# Patient Record
Sex: Female | Born: 1980 | Race: White | Hispanic: No | Marital: Married | Smoking: Current every day smoker
Health system: Southern US, Community
[De-identification: ages and names within clinical notes are randomized; demographics above are authoritative.]

## PROBLEM LIST (undated history)

## (undated) DIAGNOSIS — Z8614 Personal history of Methicillin resistant Staphylococcus aureus infection: Secondary | ICD-10-CM

## (undated) DIAGNOSIS — M542 Cervicalgia: Secondary | ICD-10-CM

## (undated) DIAGNOSIS — G5603 Carpal tunnel syndrome, bilateral upper limbs: Secondary | ICD-10-CM

## (undated) DIAGNOSIS — Z8489 Family history of other specified conditions: Secondary | ICD-10-CM

## (undated) DIAGNOSIS — G5693 Unspecified mononeuropathy of bilateral upper limbs: Secondary | ICD-10-CM

---

## 2006-02-17 ENCOUNTER — Emergency Department: Payer: Self-pay | Admitting: Emergency Medicine

## 2006-07-23 ENCOUNTER — Emergency Department: Payer: Self-pay | Admitting: Emergency Medicine

## 2006-07-25 ENCOUNTER — Emergency Department: Payer: Self-pay | Admitting: Emergency Medicine

## 2006-08-08 ENCOUNTER — Emergency Department: Payer: Self-pay | Admitting: Emergency Medicine

## 2006-12-08 ENCOUNTER — Emergency Department: Payer: Self-pay | Admitting: Unknown Physician Specialty

## 2007-09-29 ENCOUNTER — Emergency Department: Payer: Self-pay | Admitting: Emergency Medicine

## 2008-03-23 ENCOUNTER — Emergency Department: Payer: Self-pay | Admitting: Emergency Medicine

## 2008-08-02 ENCOUNTER — Emergency Department: Payer: Self-pay | Admitting: Emergency Medicine

## 2009-07-18 ENCOUNTER — Emergency Department: Payer: Self-pay | Admitting: Emergency Medicine

## 2010-09-14 ENCOUNTER — Emergency Department: Payer: Self-pay | Admitting: Emergency Medicine

## 2012-10-08 ENCOUNTER — Emergency Department: Payer: Self-pay | Admitting: Internal Medicine

## 2012-10-08 LAB — RAPID INFLUENZA A&B ANTIGENS

## 2013-10-11 IMAGING — CT CT HEAD WITHOUT CONTRAST
1 series · 16 of 30 positions shown, 20 images · non-contrast
Comparison: none

REASON FOR EXAM: headache for 3 days
COMMENTS:   LMP: pending results of preg test

PROCEDURE:     CT  - CT HEAD WITHOUT CONTRAST  - October 08, 2012 [DATE]
RESULT:     Technique: Helical 5mm sections were obtained from the skull
base to the vertex without administration of intravenous contrast.

[Series 2: soft tissue · axial · 0.40mm/px · z∈[-56,+84]mm · 16 of 32 slices shown, 20 images]
[im 2/32  brain]
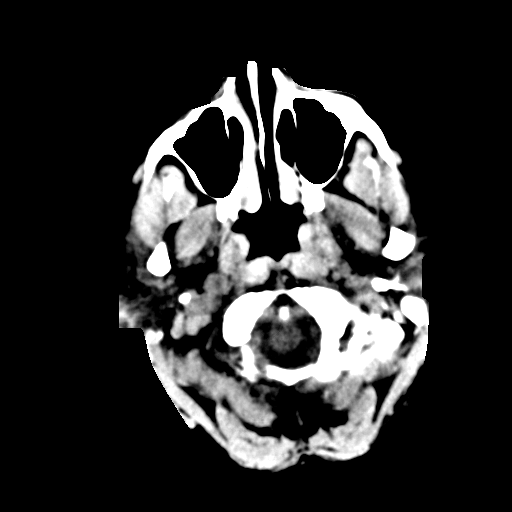
[im 2/32  bone]
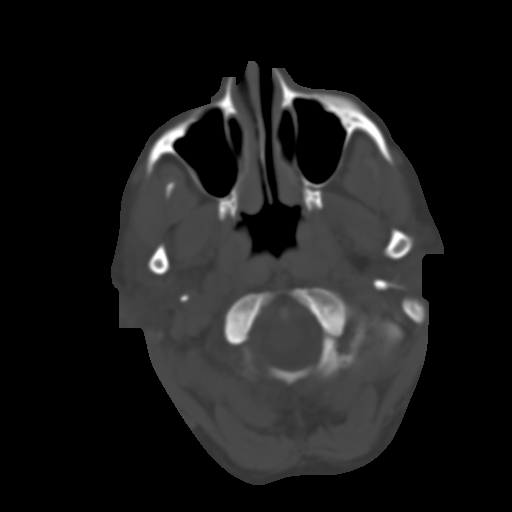
[im 4/32  brain]
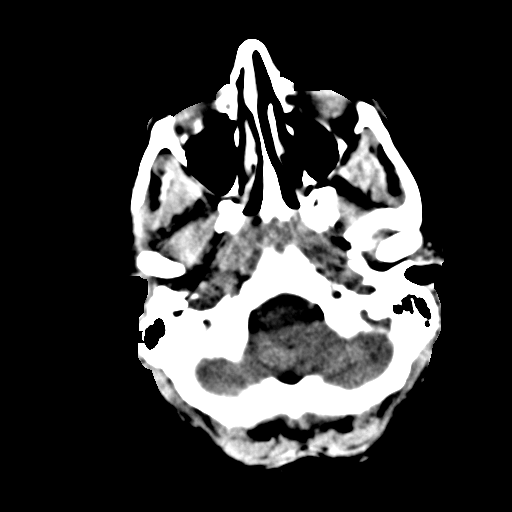
[im 6/32  brain]
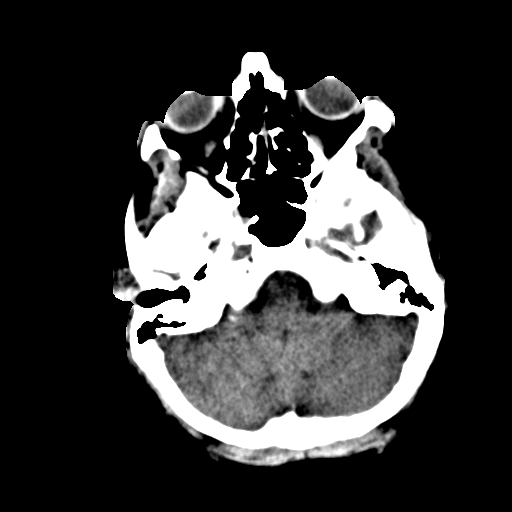
[im 8/32  brain]
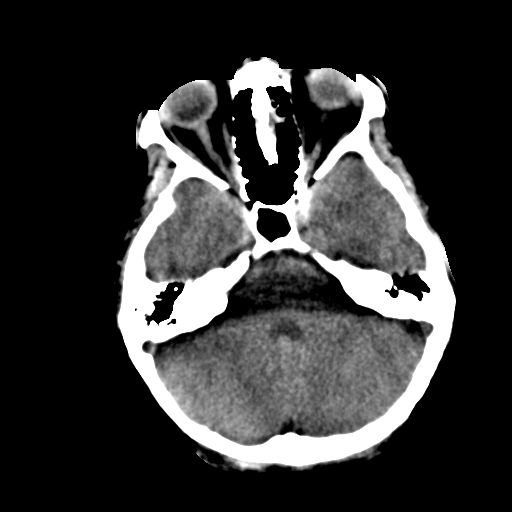
[im 9/32  brain]
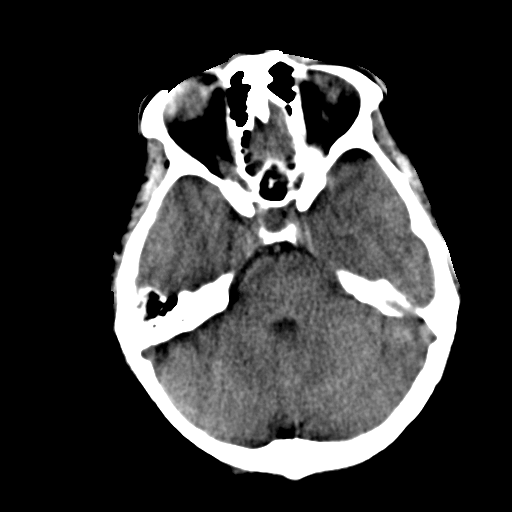
[im 9/32  bone]
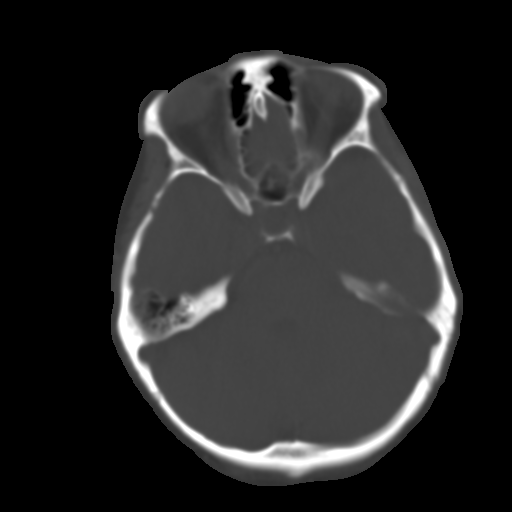
[im 11/32  brain]
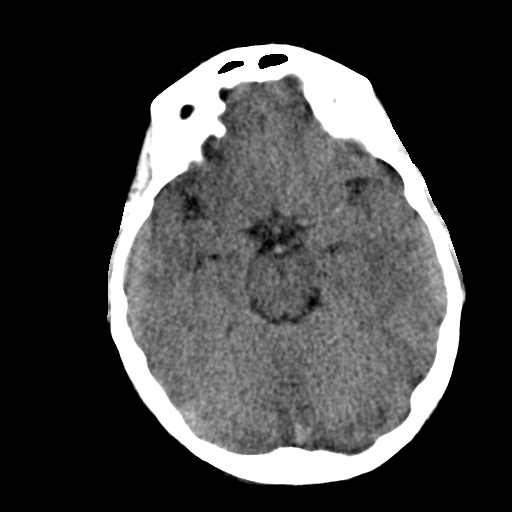
[im 13/32  brain]
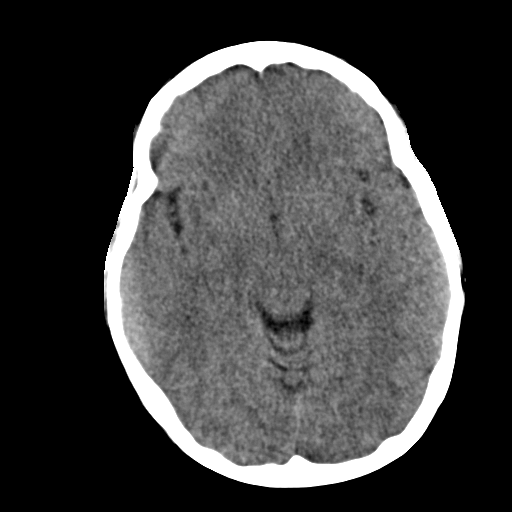
[im 15/32  brain]
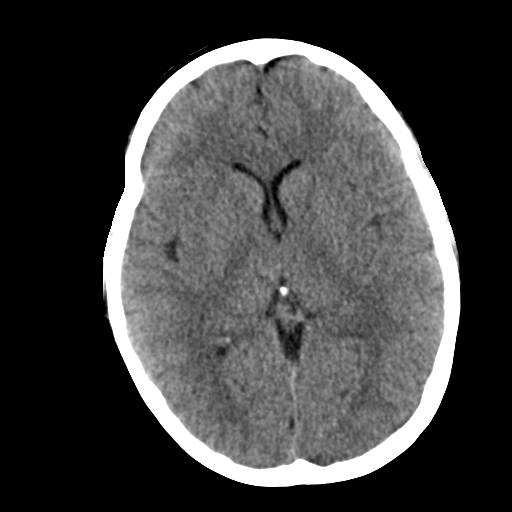
[im 17/32  brain]
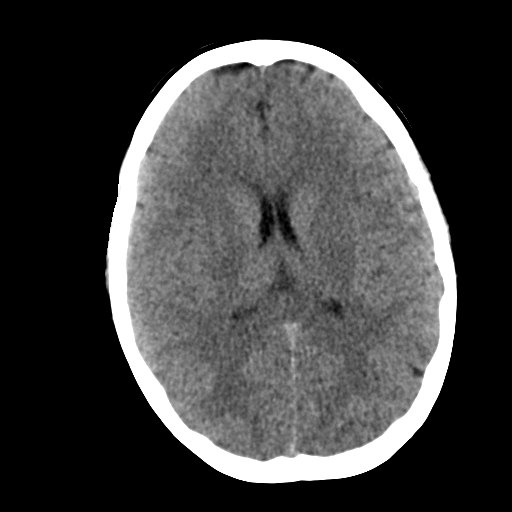
[im 17/32  bone]
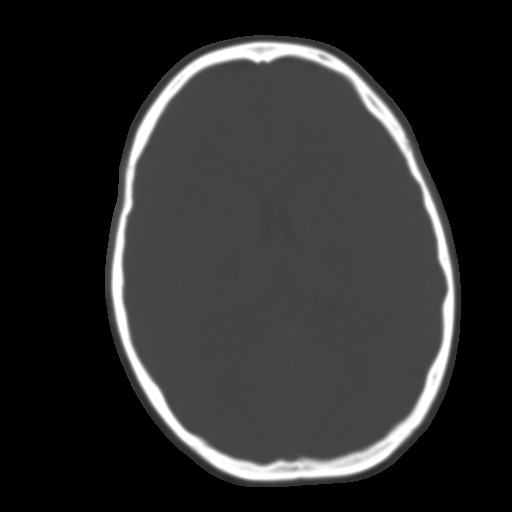
[im 19/32  brain]
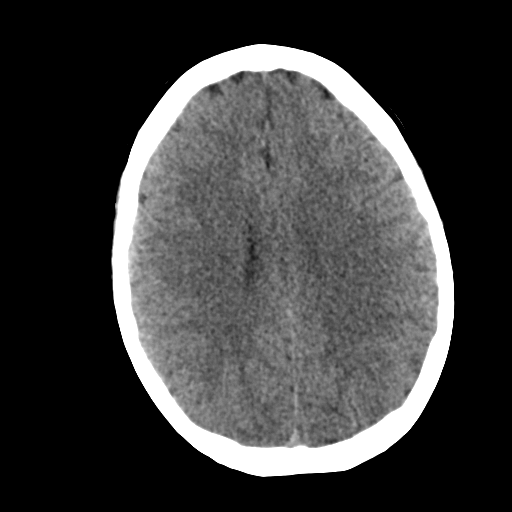
[im 21/32  brain]
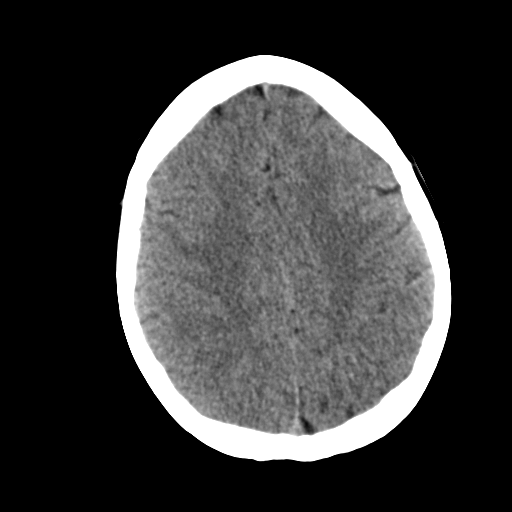
[im 23/32  brain]
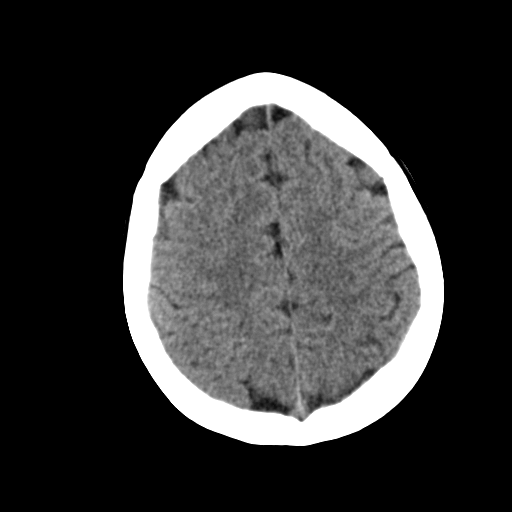
[im 24/32  brain]
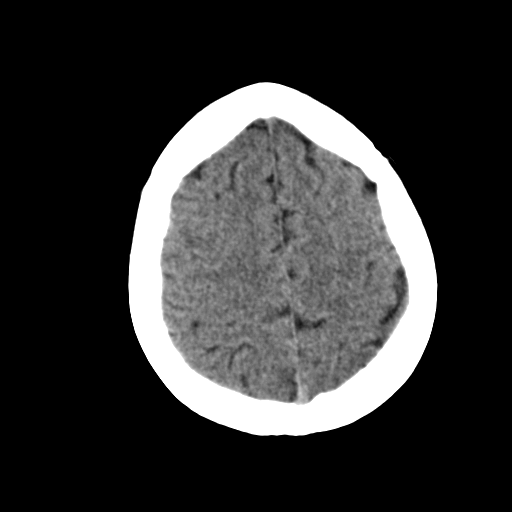
[im 24/32  bone]
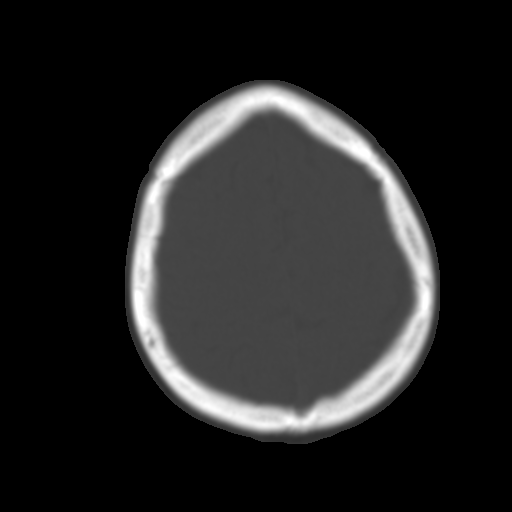
[im 26/32  brain]
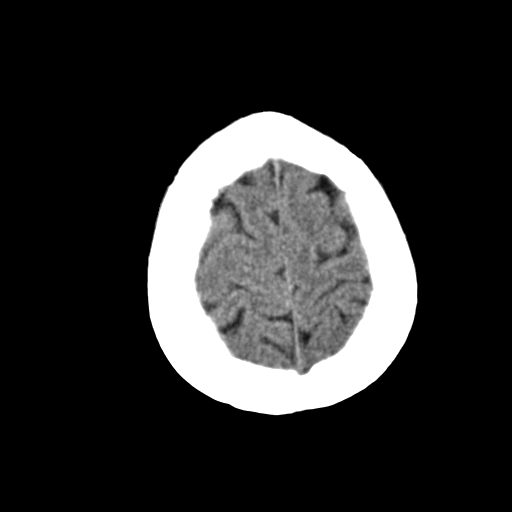
[im 28/32  brain]
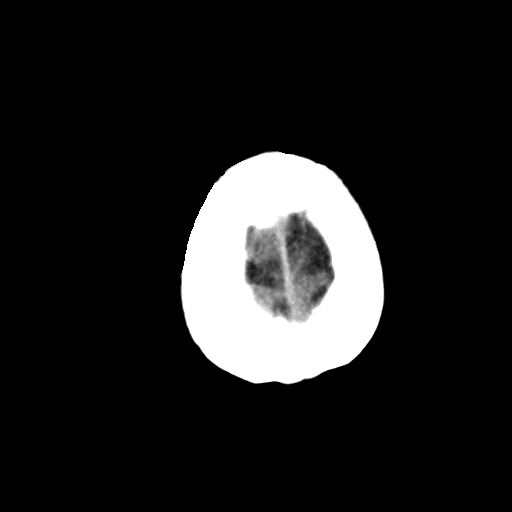
[im 30/32  brain]
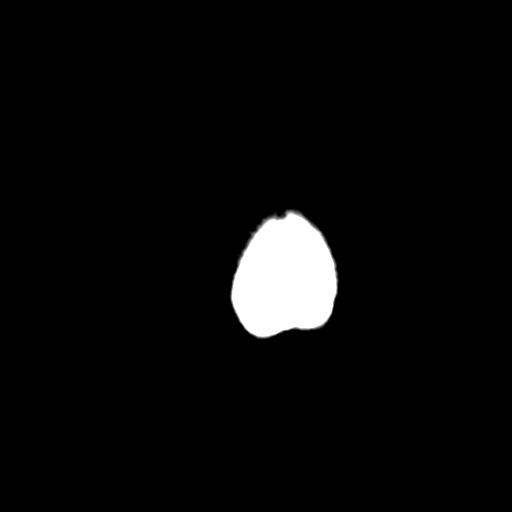

[16 of 30 positions shown; findings below may reference images not displayed]

FINDINGS: There is not evidence of intra-axial fluid collections. There is
no evidence of acute hemorrhage or secondary signs reflecting mass effect or
subacute or chronic focal territorial infarction. The osseous structures
demonstrate no evidence of a depressed skull fracture. If there is
persistent concern clinical follow-up with MRI is recommended.
IMPRESSION: 1. No evidence of acute intracranial abnormalities.

## 2013-10-29 ENCOUNTER — Other Ambulatory Visit: Payer: Self-pay

## 2013-10-29 LAB — HCG, QUANTITATIVE, PREGNANCY: Beta Hcg, Quant.: 1199 m[IU]/mL — ABNORMAL HIGH

## 2014-09-16 ENCOUNTER — Inpatient Hospital Stay: Payer: Self-pay

## 2014-09-17 LAB — HEMATOCRIT: HCT: 32.2 % — ABNORMAL LOW (ref 35.0–47.0)

## 2015-02-28 NOTE — H&P (Signed)
L&D Evaluation:  History:  HPI Pt is a 34 yo G8P2052 at 40.[redacted] weeks GA with an EDC of 09/15/14 by a 6 week u/s. Pt presents with increased painful contractions that started at 8am this morning. She reports that she has had contractions on and off during the entire pregnancy. She reports +FM, denies vb, or lof. Her history is significant for multiple fibroids, 5 SAB's, and a leep. She is O+, RI, VI, GBS negative.   Presents with contractions   Patient's Medical History migraines, fibroids,   Patient's Surgical History D&C  LEEP   Medications Pre Natal Vitamins   Allergies NKDA   Social History tobacco  < 1ppd   Family History Non-Contributory   ROS:  ROS All systems were reviewed.  HEENT, CNS, GI, GU, Respiratory, CV, Renal and Musculoskeletal systems were found to be normal.   Exam:  Vital Signs elevated- pt in severe pain   General appears in severe pain, pt crying and gripping the bed writhing in pain   Mental Status clear   Chest clear   Heart normal sinus rhythm   Abdomen gravid, tender with contractions   Pelvic C/C/-1   Mebranes Intact, AROm for clear fluid   Description clear   FHT normal rate with no decels, 1teens-120's, moderate variability   Ucx regular   Skin dry, no lesions, no rashes   Lymph no lymphadenopathy   Impression:  Impression active labor, reactive NST, IUP at 40.1, labor   Plan:  Plan EFM/NST, anticipate svd   Follow Up Appointment need to schedule   Electronic Signatures: Jannet MantisSubudhi, Valda Christenson (CNM)  (Signed 27-Nov-15 12:17)  Authored: L&D Evaluation   Last Updated: 27-Nov-15 12:17 by Jannet MantisSubudhi, Lukus Binion (CNM)

## 2017-12-14 ENCOUNTER — Other Ambulatory Visit: Payer: Self-pay | Admitting: Obstetrics and Gynecology

## 2017-12-15 NOTE — Telephone Encounter (Signed)
Please advise 

## 2019-07-22 HISTORY — PX: WISDOM TOOTH EXTRACTION: SHX21

## 2019-07-28 ENCOUNTER — Ambulatory Visit: Payer: Self-pay | Admitting: Surgery

## 2019-07-28 NOTE — H&P (Signed)
Subjective:  CC: Non-recurrent unilateral inguinal hernia without obstruction or gangrene [K40.90]  HPI:  Michaela Barajas is a 38 y.o. female who was referred by Thomas Janse Schermerho* for evaluation of above. Symptoms were first noted 6 months ago. Pain is intermittent and discomfort, confined to the right groin, without radiation. Associated with nothing specific, exacerbated by nothing specific, Lump is not reducible.  Past Medical History: none reported  Past Surgical History:       Past Surgical History:  Procedure Laterality Date  . HERNIA REPAIR    . TONSILLECTOMY    umbilical hernia repair as infant  Family History: reviewed and non-contributory to CC  Social History: reports that she has been smoking. She has never used smokeless tobacco. She reports current alcohol use. She reports that she does not use drugs.  Current Medications: has a current medication list which includes the following prescription(s): cholecalciferol and prenatal vit-iron fum-folic ac.  Allergies:     Allergies as of 07/28/2019  . (No Known Allergies)  ROS:  A 15 point review of systems was performed and pertinent positives and negatives noted in HPI  Objective:  BP 121/76  Pulse 91  Ht 167.6 cm (5' 6")  Wt 76.2 kg (168 lb)  BMI 27.12 kg/m  Constitutional :  alert, appears stated age, cooperative and no distress  Lymphatics/Throat:  no asymmetry, masses, or scars  Respiratory:  clear to auscultation bilaterally  Cardiovascular:  regular rate and rhythm  Gastrointestinal: soft, non-tender; bowel sounds normal; no masses, no organomegaly. inguinal hernia noted. moderate, reducible, no overlying skin changes and RIGHT  Musculoskeletal: Steady gait and movement  Skin: Cool and moist, no visible surgical scars   Psychiatric: Normal affect, non-agitated, not confused     LABS:  n/a  RADS:  n/a  Assessment:  Non-recurrent unilateral inguinal hernia without obstruction or gangrene [K40.90]   smoking  Plan:  1. Non-recurrent unilateral inguinal hernia without obstruction or gangrene [K40.90]  Discussed the risk of surgery including recurrence, which can be up to 50% in the case of incisional or complex hernias, possible use of prosthetic materials (mesh) and the increased risk of mesh infxn if used, bleeding, chronic pain, post-op infxn, post-op SBO or ileus, and possible re-operation to address said risks. The risks of general anesthetic, if used, includes MI, CVA, sudden death or even reaction to anesthetic medications also discussed. Alternatives include continued observation. Benefits inclpude possible symptom relief, prevention of incarceration, strangulation, enlargement in size over time, and the risk of emergency surgery in the face of strangulation.  Typical post-op recovery time of 3-5 days with 2 weeks of activity restrictions were also discussed.  ED return precautions given for sudden increase in pain, size of hernia with accompanying fever, nausea, and/or vomiting.  The patient verbalized understanding and all questions were answered to the patient's satisfaction.  2. Patient has elected to proceed with surgical treatment. Procedure will be scheduled. Written consent was obtained. Robotic assisted, 2wk lifting restriction  Discussed smoking cessation as well to minimize complications noted above. Pt opted to try on her own, declined any specific support at this time.   

## 2019-07-28 NOTE — H&P (View-Only) (Signed)
Subjective:  CC: Non-recurrent unilateral inguinal hernia without obstruction or gangrene [K40.90]  HPI:  Michaela Barajas is a 38 y.o. female who was referred by Remi Haggard* for evaluation of above. Symptoms were first noted 6 months ago. Pain is intermittent and discomfort, confined to the right groin, without radiation. Associated with nothing specific, exacerbated by nothing specific, Lump is not reducible.  Past Medical History: none reported  Past Surgical History:       Past Surgical History:  Procedure Laterality Date  . HERNIA REPAIR    . TONSILLECTOMY    umbilical hernia repair as infant  Family History: reviewed and non-contributory to CC  Social History: reports that she has been smoking. She has never used smokeless tobacco. She reports current alcohol use. She reports that she does not use drugs.  Current Medications: has a current medication list which includes the following prescription(s): cholecalciferol and prenatal vit-iron fum-folic ac.  Allergies:     Allergies as of 07/28/2019  . (No Known Allergies)  ROS:  A 15 point review of systems was performed and pertinent positives and negatives noted in HPI  Objective:  BP 121/76  Pulse 91  Ht 167.6 cm (5\' 6" )  Wt 76.2 kg (168 lb)  BMI 27.12 kg/m  Constitutional :  alert, appears stated age, cooperative and no distress  Lymphatics/Throat:  no asymmetry, masses, or scars  Respiratory:  clear to auscultation bilaterally  Cardiovascular:  regular rate and rhythm  Gastrointestinal: soft, non-tender; bowel sounds normal; no masses, no organomegaly. inguinal hernia noted. moderate, reducible, no overlying skin changes and RIGHT  Musculoskeletal: Steady gait and movement  Skin: Cool and moist, no visible surgical scars   Psychiatric: Normal affect, non-agitated, not confused     LABS:  n/a  RADS:  n/a  Assessment:  Non-recurrent unilateral inguinal hernia without obstruction or gangrene [K40.90]   smoking  Plan:  1. Non-recurrent unilateral inguinal hernia without obstruction or gangrene [K40.90]  Discussed the risk of surgery including recurrence, which can be up to 50% in the case of incisional or complex hernias, possible use of prosthetic materials (mesh) and the increased risk of mesh infxn if used, bleeding, chronic pain, post-op infxn, post-op SBO or ileus, and possible re-operation to address said risks. The risks of general anesthetic, if used, includes MI, CVA, sudden death or even reaction to anesthetic medications also discussed. Alternatives include continued observation. Benefits inclpude possible symptom relief, prevention of incarceration, strangulation, enlargement in size over time, and the risk of emergency surgery in the face of strangulation.  Typical post-op recovery time of 3-5 days with 2 weeks of activity restrictions were also discussed.  ED return precautions given for sudden increase in pain, size of hernia with accompanying fever, nausea, and/or vomiting.  The patient verbalized understanding and all questions were answered to the patient's satisfaction.  2. Patient has elected to proceed with surgical treatment. Procedure will be scheduled. Written consent was obtained. Robotic assisted, 2wk lifting restriction  Discussed smoking cessation as well to minimize complications noted above. Pt opted to try on her own, declined any specific support at this time.

## 2019-08-02 ENCOUNTER — Other Ambulatory Visit: Payer: Self-pay

## 2019-08-02 ENCOUNTER — Encounter: Payer: Self-pay | Admitting: *Deleted

## 2019-08-02 ENCOUNTER — Encounter
Admission: RE | Admit: 2019-08-02 | Discharge: 2019-08-02 | Disposition: A | Discharge status: Home / Self Care | Payer: 59 | Source: Ambulatory Visit | Attending: Surgery | Admitting: Surgery

## 2019-08-02 DIAGNOSIS — Z01812 Encounter for preprocedural laboratory examination: Secondary | ICD-10-CM | POA: Insufficient documentation

## 2019-08-02 HISTORY — DX: Personal history of Methicillin resistant Staphylococcus aureus infection: Z86.14

## 2019-08-02 HISTORY — DX: Family history of other specified conditions: Z84.89

## 2019-08-02 NOTE — Patient Instructions (Addendum)
Your procedure is scheduled on: 08-06-19 FRIDAY Report to Same Day Surgery 2nd floor medical mall [facility] [place] Entrance-take elevator on left to 2nd floor.  Check in with surgery information desk.) To find out your arrival time please call [phone] between 1PM - 3PM on 08-05-19 THURSDAY  Remember: Instructions that are not followed completely may result in serious medical risk, up to and including death, or upon the discretion of your surgeon and anesthesiologist your surgery may need to be rescheduled.    _x___ 1. Do not eat food after midnight the night before your procedure. NO GUM OR CANDY AFTER MIDNIGHT. You may drink clear liquids up to 2 hours before you are scheduled to arrive at the hospital for your procedure.  Do not drink clear liquids within 2 hours of your scheduled arrival to the hospital.  Clear liquids include  --Water or Apple juice without pulp  --Gatorade  --Black Coffee or Clear Tea (No milk, no creamers, do not add anything to the coffee or Tea)   ____Ensure clear carbohydrate drink on the way to the hospital for bariatric patients  ____Ensure clear carbohydrate drink 3 hours before surgery.    __x__ 2. No Alcohol for 24 hours before or after surgery.   __x__3. No Smoking or e-cigarettes for 24 prior to surgery.  Do not use any chewable tobacco products for at least 6 hour prior to surgery   ____  4. Bring all medications with you on the day of surgery if instructed.    __x__ 5. Notify your doctor if there is any change in your medical condition     (cold, fever, infections).    x___6. On the morning of surgery brush your teeth with toothpaste and water.  You may rinse your mouth with mouth wash if you wish.  Do not swallow any toothpaste or mouthwash.   Do not wear jewelry, make-up, hairpins, clips or nail polish.  Do not wear lotions, powders, or perfumes. You may wear deodorant.  Do not shave 48 hours prior to surgery. Men may shave face and neck.  Do  not bring valuables to the hospital.    [facility] is not responsible for any belongings or valuables.               Contacts, dentures or bridgework may not be worn into surgery.  Leave your suitcase in the car. After surgery it may be brought to your room.  For patients admitted to the hospital, discharge time is determined by your treatment team.  _  Patients discharged the day of surgery will not be allowed to drive home.  You will need someone to drive you home and stay with you the night of your procedure.    Please read over the following fact sheets that you were given:   [facility] Preparing for Surgery   ____ Take anti-hypertensive listed below, cardiac, seizure, asthma, anti-reflux and psychiatric medicines. These include:  1. NONE  2.  3.  4.  5.  6.  ____Fleets enema or Magnesium Citrate as directed.   _x___ Use CHG Soap or sage wipes as directed on instruction sheet   ____ Use inhalers on the day of surgery and bring to hospital day of surgery  ____ Stop Metformin and Janumet 2 days prior to surgery.    ____ Take 1/2 of usual insulin dose the night before surgery and none on the morning surgery.   ____ Follow recommendations from Cardiologist, Pulmonologist or PCP regarding stopping  Aspirin, Coumadin, Plavix ,Eliquis, Effient, or Pradaxa, and Pletal.  X____Stop Anti-inflammatories such as Advil, Aleve, Ibuprofen, Motrin, Naproxen, Naprosyn, Goodies powders or aspirin products NOW-OK to take Tylenol    _x___ Stop supplements until after surgery-STOP BIOTIN (HAIR, SKIN AND NAILS) NOW-MAY RESUME AFTER SURGERY   ____ Bring C-Pap to the hospital.

## 2019-08-03 ENCOUNTER — Other Ambulatory Visit
Admission: RE | Admit: 2019-08-03 | Discharge: 2019-08-03 | Disposition: A | Discharge status: Home / Self Care | Payer: 59 | Source: Ambulatory Visit | Attending: Surgery | Admitting: Surgery

## 2019-08-03 DIAGNOSIS — Z20828 Contact with and (suspected) exposure to other viral communicable diseases: Secondary | ICD-10-CM | POA: Diagnosis not present

## 2019-08-03 DIAGNOSIS — Z01812 Encounter for preprocedural laboratory examination: Secondary | ICD-10-CM | POA: Diagnosis not present

## 2019-08-03 LAB — SARS CORONAVIRUS 2 (TAT 6-24 HRS): SARS Coronavirus 2: NEGATIVE

## 2019-08-06 ENCOUNTER — Ambulatory Visit: Payer: 59 | Admitting: Anesthesiology

## 2019-08-06 ENCOUNTER — Ambulatory Visit
Admission: RE | Admit: 2019-08-06 | Discharge: 2019-08-06 | Disposition: A | Discharge status: Home / Self Care | Payer: 59 | Attending: Surgery | Admitting: Surgery

## 2019-08-06 ENCOUNTER — Encounter: Payer: Self-pay | Admitting: *Deleted

## 2019-08-06 ENCOUNTER — Encounter
Admission: RE | Disposition: A | Discharge status: Home / Self Care | Payer: Self-pay | Source: Home / Self Care | Attending: Surgery

## 2019-08-06 ENCOUNTER — Other Ambulatory Visit: Payer: Self-pay

## 2019-08-06 DIAGNOSIS — F17200 Nicotine dependence, unspecified, uncomplicated: Secondary | ICD-10-CM | POA: Diagnosis not present

## 2019-08-06 DIAGNOSIS — K4090 Unilateral inguinal hernia, without obstruction or gangrene, not specified as recurrent: Secondary | ICD-10-CM | POA: Diagnosis present

## 2019-08-06 HISTORY — PX: XI ROBOTIC ASSISTED INGUINAL HERNIA REPAIR WITH MESH: SHX6706

## 2019-08-06 LAB — POCT PREGNANCY, URINE: Preg Test, Ur: NEGATIVE

## 2019-08-06 SURGERY — REPAIR, HERNIA, INGUINAL, ROBOT-ASSISTED, LAPAROSCOPIC, USING MESH
Anesthesia: General | Site: Inguinal | Laterality: Right

## 2019-08-06 MED ORDER — SUGAMMADEX SODIUM 200 MG/2ML IV SOLN
INTRAVENOUS | Status: DC | PRN
  Administered 2019-08-06: 200 mg via INTRAVENOUS

## 2019-08-06 MED ORDER — PROPOFOL 10 MG/ML IV BOLUS
INTRAVENOUS | Status: AC
  Filled 2019-08-06: qty 20

## 2019-08-06 MED ORDER — ACETAMINOPHEN 500 MG PO TABS
ORAL_TABLET | ORAL | Status: AC
  Administered 2019-08-06: 1000 mg via ORAL
  Filled 2019-08-06: qty 2

## 2019-08-06 MED ORDER — DEXMEDETOMIDINE HCL 200 MCG/2ML IV SOLN
INTRAVENOUS | Status: DC | PRN
  Administered 2019-08-06: 16 ug via INTRAVENOUS
  Administered 2019-08-06 (×2): 8 ug via INTRAVENOUS

## 2019-08-06 MED ORDER — IBUPROFEN 800 MG PO TABS: 800.0000 mg | ORAL_TABLET | Freq: Three times a day (TID) | ORAL | 0 refills | Status: AC | PRN

## 2019-08-06 MED ORDER — ONDANSETRON HCL 4 MG/2ML IJ SOLN
INTRAMUSCULAR | Status: DC | PRN
  Administered 2019-08-06: 4 mg via INTRAVENOUS

## 2019-08-06 MED ORDER — FENTANYL CITRATE (PF) 100 MCG/2ML IJ SOLN
INTRAMUSCULAR | Status: AC
  Filled 2019-08-06: qty 2

## 2019-08-06 MED ORDER — SODIUM CHLORIDE 0.9 % IV SOLN
INTRAVENOUS | Status: DC | PRN
  Administered 2019-08-06: 75 mL

## 2019-08-06 MED ORDER — FENTANYL CITRATE (PF) 100 MCG/2ML IJ SOLN
INTRAMUSCULAR | Status: DC | PRN
  Administered 2019-08-06: 50 ug via INTRAVENOUS
  Administered 2019-08-06: 100 ug via INTRAVENOUS
  Administered 2019-08-06: 50 ug via INTRAVENOUS

## 2019-08-06 MED ORDER — CELECOXIB 200 MG PO CAPS
ORAL_CAPSULE | ORAL | Status: AC
  Administered 2019-08-06: 200 mg via ORAL
  Filled 2019-08-06: qty 1

## 2019-08-06 MED ORDER — CEFAZOLIN SODIUM-DEXTROSE 2-4 GM/100ML-% IV SOLN
2.0000 g | INTRAVENOUS | Status: AC
  Administered 2019-08-06: 2 g via INTRAVENOUS

## 2019-08-06 MED ORDER — FENTANYL CITRATE (PF) 100 MCG/2ML IJ SOLN: 25.0000 ug | INTRAMUSCULAR | Status: DC | PRN

## 2019-08-06 MED ORDER — MIDAZOLAM HCL 2 MG/2ML IJ SOLN
INTRAMUSCULAR | Status: AC
  Filled 2019-08-06: qty 2

## 2019-08-06 MED ORDER — DOCUSATE SODIUM 100 MG PO CAPS: 100.0000 mg | ORAL_CAPSULE | Freq: Two times a day (BID) | ORAL | 0 refills | Status: AC | PRN

## 2019-08-06 MED ORDER — HYDROCODONE-ACETAMINOPHEN 5-325 MG PO TABS: 1.0000 | ORAL_TABLET | Freq: Four times a day (QID) | ORAL | 0 refills | Status: AC | PRN

## 2019-08-06 MED ORDER — BUPIVACAINE-EPINEPHRINE (PF) 0.5% -1:200000 IJ SOLN
INTRAMUSCULAR | Status: AC
  Filled 2019-08-06: qty 30

## 2019-08-06 MED ORDER — PROPOFOL 10 MG/ML IV BOLUS
INTRAVENOUS | Status: DC | PRN
  Administered 2019-08-06: 150 mg via INTRAVENOUS

## 2019-08-06 MED ORDER — FAMOTIDINE 20 MG PO TABS
20.0000 mg | ORAL_TABLET | Freq: Once | ORAL | Status: AC
  Administered 2019-08-06: 12:00:00 20 mg via ORAL

## 2019-08-06 MED ORDER — BUPIVACAINE-EPINEPHRINE 0.5% -1:200000 IJ SOLN
INTRAMUSCULAR | Status: DC | PRN
  Administered 2019-08-06: 15 mL

## 2019-08-06 MED ORDER — CELECOXIB 200 MG PO CAPS
200.0000 mg | ORAL_CAPSULE | ORAL | Status: AC
  Administered 2019-08-06: 12:00:00 200 mg via ORAL

## 2019-08-06 MED ORDER — LACTATED RINGERS IV SOLN
INTRAVENOUS | Status: DC
  Administered 2019-08-06: 12:00:00 via INTRAVENOUS

## 2019-08-06 MED ORDER — LIDOCAINE HCL (PF) 2 % IJ SOLN
INTRAMUSCULAR | Status: AC
  Filled 2019-08-06: qty 10

## 2019-08-06 MED ORDER — ROCURONIUM BROMIDE 50 MG/5ML IV SOLN
INTRAVENOUS | Status: AC
  Filled 2019-08-06: qty 1

## 2019-08-06 MED ORDER — ACETAMINOPHEN 500 MG PO TABS
1000.0000 mg | ORAL_TABLET | ORAL | Status: AC
  Administered 2019-08-06: 12:00:00 1000 mg via ORAL

## 2019-08-06 MED ORDER — BUPIVACAINE LIPOSOME 1.3 % IJ SUSP
INTRAMUSCULAR | Status: AC
  Filled 2019-08-06: qty 80

## 2019-08-06 MED ORDER — DEXAMETHASONE SODIUM PHOSPHATE 10 MG/ML IJ SOLN
INTRAMUSCULAR | Status: DC | PRN
  Administered 2019-08-06: 10 mg via INTRAVENOUS

## 2019-08-06 MED ORDER — ACETAMINOPHEN 325 MG PO TABS: 650.0000 mg | ORAL_TABLET | Freq: Three times a day (TID) | ORAL | 0 refills | Status: AC | PRN

## 2019-08-06 MED ORDER — OXYCODONE HCL 5 MG PO TABS: 5.0000 mg | ORAL_TABLET | Freq: Once | ORAL | Status: DC | PRN

## 2019-08-06 MED ORDER — ROCURONIUM BROMIDE 100 MG/10ML IV SOLN
INTRAVENOUS | Status: DC | PRN
  Administered 2019-08-06: 50 mg via INTRAVENOUS
  Administered 2019-08-06 (×2): 20 mg via INTRAVENOUS

## 2019-08-06 MED ORDER — FAMOTIDINE 20 MG PO TABS
ORAL_TABLET | ORAL | Status: AC
  Administered 2019-08-06: 20 mg via ORAL
  Filled 2019-08-06: qty 1

## 2019-08-06 MED ORDER — CHLORHEXIDINE GLUCONATE CLOTH 2 % EX PADS: 6.0000 | MEDICATED_PAD | Freq: Once | CUTANEOUS | Status: DC

## 2019-08-06 MED ORDER — CEFAZOLIN SODIUM-DEXTROSE 2-4 GM/100ML-% IV SOLN
INTRAVENOUS | Status: AC
  Filled 2019-08-06: qty 100

## 2019-08-06 MED ORDER — HYDROCODONE-ACETAMINOPHEN 5-325 MG PO TABS
1.0000 | ORAL_TABLET | Freq: Once | ORAL | Status: AC
  Administered 2019-08-06: 1 via ORAL

## 2019-08-06 MED ORDER — GABAPENTIN 300 MG PO CAPS
ORAL_CAPSULE | ORAL | Status: AC
  Administered 2019-08-06: 300 mg via ORAL
  Filled 2019-08-06: qty 1

## 2019-08-06 MED ORDER — MIDAZOLAM HCL 2 MG/2ML IJ SOLN
INTRAMUSCULAR | Status: DC | PRN
  Administered 2019-08-06: 2 mg via INTRAVENOUS

## 2019-08-06 MED ORDER — LIDOCAINE HCL (CARDIAC) PF 100 MG/5ML IV SOSY
PREFILLED_SYRINGE | INTRAVENOUS | Status: DC | PRN
  Administered 2019-08-06: 100 mg via INTRAVENOUS

## 2019-08-06 MED ORDER — HYDROCODONE-ACETAMINOPHEN 5-325 MG PO TABS
ORAL_TABLET | ORAL | Status: AC
  Filled 2019-08-06: qty 1

## 2019-08-06 MED ORDER — GABAPENTIN 300 MG PO CAPS
300.0000 mg | ORAL_CAPSULE | ORAL | Status: AC
  Administered 2019-08-06: 12:00:00 300 mg via ORAL

## 2019-08-06 MED ORDER — SODIUM CHLORIDE FLUSH 0.9 % IV SOLN
INTRAVENOUS | Status: AC
  Filled 2019-08-06: qty 20

## 2019-08-06 MED ORDER — DEXMEDETOMIDINE HCL IN NACL 80 MCG/20ML IV SOLN
INTRAVENOUS | Status: AC
  Filled 2019-08-06: qty 20

## 2019-08-06 MED ORDER — SODIUM CHLORIDE FLUSH 0.9 % IV SOLN
INTRAVENOUS | Status: AC
  Filled 2019-08-06: qty 40

## 2019-08-06 MED ORDER — OXYCODONE HCL 5 MG/5ML PO SOLN: 5.0000 mg | Freq: Once | ORAL | Status: DC | PRN

## 2019-08-06 MED ORDER — SEVOFLURANE IN SOLN
RESPIRATORY_TRACT | Status: AC
  Filled 2019-08-06: qty 250

## 2019-08-06 SURGICAL SUPPLY — 59 items
BAG INFUSER PRESSURE 100CC (MISCELLANEOUS) IMPLANT
BLADE SURG SZ11 CARB STEEL (BLADE) ×3 IMPLANT
BNDG GAUZE 4.5X4.1 6PLY STRL (MISCELLANEOUS) IMPLANT
CANISTER SUCT 1200ML W/VALVE (MISCELLANEOUS) ×3 IMPLANT
CHLORAPREP W/TINT 26 (MISCELLANEOUS) ×3 IMPLANT
COVER TIP SHEARS 8 DVNC (MISCELLANEOUS) ×2 IMPLANT
COVER TIP SHEARS 8MM DA VINCI (MISCELLANEOUS) ×4
COVER WAND RF STERILE (DRAPES) IMPLANT
DEFOGGER SCOPE WARMER CLEARIFY (MISCELLANEOUS) ×3 IMPLANT
DERMABOND ADVANCED (GAUZE/BANDAGES/DRESSINGS) ×2
DERMABOND ADVANCED .7 DNX12 (GAUZE/BANDAGES/DRESSINGS) ×1 IMPLANT
DRAPE 3/4 80X56 (DRAPES) ×3 IMPLANT
DRAPE ARM DVNC X/XI (DISPOSABLE) ×3 IMPLANT
DRAPE COLUMN DVNC XI (DISPOSABLE) ×1 IMPLANT
DRAPE DA VINCI XI ARM (DISPOSABLE) ×6
DRAPE DA VINCI XI COLUMN (DISPOSABLE) ×2
ELECT CAUTERY BLADE 6.4 (BLADE) IMPLANT
ELECT REM PT RETURN 9FT ADLT (ELECTROSURGICAL) ×3
ELECTRODE REM PT RTRN 9FT ADLT (ELECTROSURGICAL) ×1 IMPLANT
GLOVE BIOGEL PI IND STRL 7.0 (GLOVE) ×2 IMPLANT
GLOVE BIOGEL PI INDICATOR 7.0 (GLOVE) ×4
GLOVE SURG SYN 6.5 ES PF (GLOVE) ×6 IMPLANT
GOWN STRL REUS W/ TWL LRG LVL3 (GOWN DISPOSABLE) ×3 IMPLANT
GOWN STRL REUS W/TWL LRG LVL3 (GOWN DISPOSABLE) ×6
IRRIGATOR SUCT 8 DISP DVNC XI (IRRIGATION / IRRIGATOR) IMPLANT
IRRIGATOR SUCTION 8MM XI DISP (IRRIGATION / IRRIGATOR)
IV NS 1000ML (IV SOLUTION)
IV NS 1000ML BAXH (IV SOLUTION) IMPLANT
KIT PINK PAD W/HEAD ARE REST (MISCELLANEOUS) ×3
KIT PINK PAD W/HEAD ARM REST (MISCELLANEOUS) ×1 IMPLANT
LABEL OR SOLS (LABEL) IMPLANT
MESH 3DMAX 4X6 RT LRG (Mesh General) ×2 IMPLANT
MESH 3DMAX MID 4X6 RT LRG (Mesh General) ×1 IMPLANT
NEEDLE HYPO 22GX1.5 SAFETY (NEEDLE) ×3 IMPLANT
NEEDLE VERESS 14GA 120MM (NEEDLE) ×3 IMPLANT
OBTURATOR OPTICAL STANDARD 8MM (TROCAR) ×2
OBTURATOR OPTICAL STND 8 DVNC (TROCAR) ×1
OBTURATOR OPTICALSTD 8 DVNC (TROCAR) ×1 IMPLANT
PACK LAP CHOLECYSTECTOMY (MISCELLANEOUS) ×3 IMPLANT
PENCIL ELECTRO HAND CTR (MISCELLANEOUS) ×3 IMPLANT
SEAL CANN UNIV 5-8 DVNC XI (MISCELLANEOUS) ×3 IMPLANT
SEAL XI 5MM-8MM UNIVERSAL (MISCELLANEOUS) ×6
SOLUTION ELECTROLUBE (MISCELLANEOUS) ×3 IMPLANT
STAPLER CANNULA SEAL DVNC XI (STAPLE) IMPLANT
STAPLER CANNULA SEAL XI (STAPLE)
SUT DVC VLOC 3-0 CL 6 P-12 (SUTURE) ×6 IMPLANT
SUT MNCRL 4-0 (SUTURE) ×2
SUT MNCRL 4-0 27XMFL (SUTURE) ×1
SUT MNCRL AB 4-0 PS2 18 (SUTURE) ×3 IMPLANT
SUT VIC AB 2-0 SH 27 (SUTURE) ×2
SUT VIC AB 2-0 SH 27XBRD (SUTURE) ×1 IMPLANT
SUT VIC AB 3-0 SH 27 (SUTURE) ×2
SUT VIC AB 3-0 SH 27X BRD (SUTURE) ×1 IMPLANT
SUT VICRYL 0 AB UR-6 (SUTURE) ×3 IMPLANT
SUTURE MNCRL 4-0 27XMF (SUTURE) ×1 IMPLANT
SYR 30ML LL (SYRINGE) ×3 IMPLANT
TRAY FOLEY MTR SLVR 16FR STAT (SET/KITS/TRAYS/PACK) ×3 IMPLANT
TROCAR XCEL NON-BLD 5MMX100MML (ENDOMECHANICALS) ×3 IMPLANT
TUBING EVAC SMOKE HEATED PNEUM (TUBING) ×3 IMPLANT

## 2019-08-06 NOTE — Anesthesia Procedure Notes (Signed)
Procedure Name: Intubation Date/Time: 08/06/2019 12:13 PM Performed by: Leander Rams, CRNA Pre-anesthesia Checklist: Patient identified, Emergency Drugs available, Suction available, Patient being monitored and Timeout performed Patient Re-evaluated:Patient Re-evaluated prior to induction Oxygen Delivery Method: Circle system utilized Preoxygenation: Pre-oxygenation with 100% oxygen Induction Type: IV induction Ventilation: Mask ventilation without difficulty Laryngoscope Size: McGraph and 4 Grade View: Grade I Tube type: Oral Tube size: 7.0 mm Number of attempts: 1 Airway Equipment and Method: Stylet Tube secured with: Tape Dental Injury: Teeth and Oropharynx as per pre-operative assessment

## 2019-08-06 NOTE — Op Note (Signed)
Preoperative diagnosis: Right inguinal Hernia.  Postoperative diagnosis: Right indirect inguinal Hernia  Procedure: Robotic assisted laparoscopic right indirect inguinal hernia repair with mesh  Anesthesia: General  Surgeon: Dr. Lysle Pearl  Wound Classification: Clean  Specimen: none  Complications: None  Estimated Blood Loss: 68mL   Indications:  inguinal hernia. Repair was indicated to avoid complications of incarceration, obstruction and pain, and a prosthetic mesh repair was elected.  See H&P for further details.  Findings: 1. Vas Deferens and cord structures identified and preserved 2. Bard 3D mesh used for repair 3. Adequate hemostasis achieved  Description of procedure: The patient was taken to the operating room. A time-out was completed verifying correct patient, procedure, site, positioning, and implant(s) and/or special equipment prior to beginning this procedure.  Right groin was prepped and draped in the usual sterile fashion. An incision was marked 20 cm above the pubic tubercle, slightly above the umbilicus  Foley catheter placed.  Incision was made at the previously marked site after injecting local anesthesia. Veress needle inserted at palmer's point.  Saline drop test noted to be positive with gradual increase in pressure after initiation of gas insufflation.  15 mm of pressure was achieved prior to removing the Veress needle and then placing a 8 mm port via the Optiview technique through the supraumbilical site.  Inspection of the area afterwards noted no injury to the surrounding organs during insertion of the needle and the port.  2 port sites were marked 8 cm to the lateral sides of the initial port, and a 8 mm robotic port was placed on the left side, another 8 mm robotic port on the right side under direct supervision.  Local anesthesia  infused to the preplanned incision site prior to insertion of the port.  The [place] was then brought into the operative  field and docked to the ports.  Examination of the abdominal cavity noted a right inguinal hernia.  A peritoneal flap was created approximately 8cm cephalad to the defect by using scissors with electrocautery.  Initial attempt at creation of the flap noted to [place] incision into the posterior fascia.  Plan was adjusted and then a peritoneal flap was successfully created and dissection was carried down towards the pubic tubercle, developing the myopectineal orifice view.  Laterally the flap was carried towards the ASIS.  Small hernia sac was noted, which carefully dissected away from the adjacent tissues to be fully reduced out of hernia cavity.  Any bleeding was controlled with combination of electrocautery and manual pressure.    After confirming adequate dissection and the peritoneal reflection completely down and away from the cord structures, a Bard 3DMax large mesh was placed within the anterior abdominal wall, secured in place using 2-0 Vicryl on an SH needle immediately above the pubic tubercle.  After noting proper placement of the mesh with the peritoneal reflection deep to it, the previously created peritoneal flap was secured back up to the anterior abdominal wall using running 3-0 V-Lock.  Posterior fascial defect was sutured back to its original position afterwards with another 3 oh V-Lock. All needles were then removed out of the abdominal cavity, Xi platform undocked from the ports and removed off of operative field.  exparel infused as ilioinguinal block.  Abdomen then desufflated and ports removed. All the skin incisions were then closed with a subcuticular stitch of Monocryl 4-0. Dermabond was applied. Foley catheter removed. The patient tolerated the procedure well and was taken to the postanesthesia care unit in  stable condition. Sponge and instrument count correct at end of procedure.

## 2019-08-06 NOTE — Progress Notes (Signed)
Dr Andree Elk aware of lip being swollen in corner, no treatment at this time.

## 2019-08-06 NOTE — Progress Notes (Signed)
Pt complaining of upper lip being sore and swollen from OR, offered ice and wash cloth but pt doesn't want either, states she just wants to go home

## 2019-08-06 NOTE — Anesthesia Post-op Follow-up Note (Signed)
Anesthesia QCDR form completed.        

## 2019-08-06 NOTE — Discharge Instructions (Signed)
AMBULATORY SURGERY  DISCHARGE INSTRUCTIONS   1) The drugs that you were given will stay in your system until tomorrow so for the next 24 hours you should not:  A) Drive an automobile B) Make any legal decisions C) Drink any alcoholic beverage   2) You may resume regular meals tomorrow.  Today it is better to start with liquids and gradually work up to solid foods.  You may eat anything you prefer, but it is better to start with liquids, then soup and crackers, and gradually work up to solid foods.   3) Please notify your doctor immediately if you have any unusual bleeding, trouble breathing, redness and pain at the surgery site, drainage, fever, or pain not relieved by medication.    4) Additional Instructions:        Please contact your physician with any problems or Same Day Surgery at [phone], Monday through Friday 6 am to 4 pm, or  at [place] Main number at [phone].   Hernia repair, Care After This sheet gives you information about how to care for yourself after your procedure. Your health care provider may also give you more specific instructions. If you have problems or questions, contact your health care provider. What can I expect after the procedure? After your procedure, it is common to have the following:  Pain in your abdomen, especially in the incision areas. You will be given medicine to control the pain.  Tiredness. This is a normal part of the recovery process. Your energy level will return to normal over the next several weeks.  Changes in your bowel movements, such as constipation or needing to go more often. Talk with your health care provider about how to manage this. Follow these instructions at home: Medicines   tylenol and advil as needed for discomfort.  Please alternate between the two every four hours as needed for pain.     Use narcotics, if prescribed, only when tylenol and motrin is not enough to control pain.    325-650mg every 8hrs to max of 3000mg/24hrs (including the 325mg in every norco dose) for the tylenol.     Advil up to 800mg per dose every 8hrs as needed for pain.    PLEASE RECORD NUMBER OF PILLS TAKEN UNTIL NEXT FOLLOW UP APPT.  THIS WILL HELP DETERMINE HOW READY YOU ARE TO BE RELEASED FROM ANY ACTIVITY RESTRICTIONS  Do not drive or use heavy machinery while taking prescription pain medicine.  Do not drink alcohol while taking prescription pain medicine.  Incision care     Follow instructions from your health care provider about how to take care of your incision areas. Make sure you: ? Keep your incisions clean and dry. ? Wash your hands with soap and water before and after applying medicine to the areas, and before and after changing your bandage (dressing). If soap and water are not available, use hand sanitizer. ? Change your dressing as told by your health care provider. ? Leave stitches (sutures), skin glue, or adhesive strips in place. These skin closures may need to stay in place for 2 weeks or longer. If adhesive strip edges start to loosen and curl up, you may trim the loose edges. Do not remove adhesive strips completely unless your health care provider tells you to do that.  Do not wear tight clothing over the incisions. Tight clothing may rub and irritate the incision areas, which may cause the incisions to open.  Do not take baths, swim, or   use a hot tub until your health care provider approves. OK TO SHOWER IN 24HRS.    Check your incision area every day for signs of infection. Check for: ? More redness, swelling, or pain. ? More fluid or blood. ? Warmth. ? Pus or a bad smell. Activity  Avoid lifting anything that is heavier than 10 lb (4.5 kg) for 2 weeks or until your health care provider says it is okay.  No pushing/pulling greater than 30lbs  You may resume normal activities as told by your health care provider. Ask your health care provider what activities are  safe for you.  Take rest breaks during the day as needed. Eating and drinking  Follow instructions from your health care provider about what you can eat after surgery.  To prevent or treat constipation while you are taking prescription pain medicine, your health care provider may recommend that you: ? Drink enough fluid to keep your urine clear or pale yellow. ? Take over-the-counter or prescription medicines. ? Eat foods that are high in fiber, such as fresh fruits and vegetables, whole grains, and beans. ? Limit foods that are high in fat and processed sugars, such as fried and sweet foods. General instructions  Ask your health care provider when you will need an appointment to get your sutures or staples removed.  Keep all follow-up visits as told by your health care provider. This is important. Contact a health care provider if:  You have more redness, swelling, or pain around your incisions.  You have more fluid or blood coming from the incisions.  Your incisions feel warm to the touch.  You have pus or a bad smell coming from your incisions or your dressing.  You have a fever.  You have an incision that breaks open (edges not staying together) after sutures or staples have been removed. Get help right away if:  You develop a rash.  You have chest pain or difficulty breathing.  You have pain or swelling in your legs.  You feel light-headed or you faint.  Your abdomen swells (becomes distended).  You have nausea or vomiting.  You have blood in your stool (feces). This information is not intended to replace advice given to you by your health care provider. Make sure you discuss any questions you have with your health care provider. Document Released: 04/26/2005 Document Revised: 06/26/2018 Document Reviewed: 07/08/2016 Elsevier Interactive Patient Education  2019 [employer]    

## 2019-08-06 NOTE — Anesthesia Preprocedure Evaluation (Signed)
Anesthesia Evaluation  Patient identified by MRN, date of birth, ID band Patient awake    Reviewed: Allergy & Precautions, H&P , NPO status , Patient's Chart, lab work & pertinent test results  History of Anesthesia Complications (+) Family history of anesthesia reaction and history of anesthetic complications  Airway Mallampati: III  TM Distance: >3 FB Neck ROM: full    Dental  (+) Chipped, Poor Dentition   Pulmonary neg shortness of breath, Current Smoker and Patient abstained from smoking.,           Cardiovascular Exercise Tolerance: Good (-) angina(-) Past MI and (-) DOE negative cardio ROS       Neuro/Psych negative neurological ROS  negative psych ROS   GI/Hepatic negative GI ROS, Neg liver ROS, neg GERD  ,  Endo/Other  negative endocrine ROS  Renal/GU      Musculoskeletal   Abdominal   Peds  Hematology negative hematology ROS (+)   Anesthesia Other Findings Past Medical History: No date: Family history of adverse reaction to anesthesia     Comment:  maternal grandmother- No date: History of methicillin resistant staphylococcus aureus (MRSA)  Past Surgical History: 07/2019: WISDOM TOOTH EXTRACTION  BMI    Body Mass Index: 27.04 kg/m      Reproductive/Obstetrics negative OB ROS                             Anesthesia Physical Anesthesia Plan  ASA: III  Anesthesia Plan: General ETT   Post-op Pain Management:    Induction: Intravenous  PONV Risk Score and Plan: Ondansetron, Dexamethasone, Midazolam and Treatment may vary due to age or medical condition  Airway Management Planned: Oral ETT  Additional Equipment:   Intra-op Plan:   Post-operative Plan: Extubation in OR  Informed Consent: I have reviewed the patients History and Physical, chart, labs and discussed the procedure including the risks, benefits and alternatives for the proposed anesthesia with the  patient or authorized representative who has indicated his/her understanding and acceptance.     Dental Advisory Given  Plan Discussed with: Anesthesiologist, CRNA and Surgeon  Anesthesia Plan Comments: (Patient consented for risks of anesthesia including but not limited to:  - adverse reactions to medications - damage to teeth, lips or other oral mucosa - sore throat or hoarseness - Damage to heart, brain, lungs or loss of life  Patient voiced understanding.)        Anesthesia Quick Evaluation

## 2019-08-06 NOTE — Transfer of Care (Signed)
Immediate Anesthesia Transfer of Care Note  Patient: Michaela Barajas  Procedure(s) Performed: XI ROBOTIC ASSISTED INGUINAL HERNIA REPAIR WITH MESH (Right Inguinal)  Patient Location: PACU  Anesthesia Type:General  Level of Consciousness: awake  Airway & Oxygen Therapy: Patient Spontanous Breathing  Post-op Assessment: Report given to RN  Post vital signs: stable  Last Vitals:  Vitals Value Taken Time  BP 103/62 08/06/19 1445  Temp 36.3 C 08/06/19 1444  Pulse 79 08/06/19 1450  Resp 15 08/06/19 1450  SpO2 100 % 08/06/19 1450  Vitals shown include unvalidated device data.  Last Pain:  Vitals:   08/06/19 1444  TempSrc:   PainSc: Asleep         Complications: No apparent anesthesia complications

## 2019-08-06 NOTE — Interval H&P Note (Signed)
History and Physical Interval Note:  08/06/2019 11:41 AM  Michaela Barajas  has presented today for surgery, with the diagnosis of K40.90 Right Inguinal Hernia.  The various methods of treatment have been discussed with the patient and family. After consideration of risks, benefits and other options for treatment, the patient has consented to  Procedure(s): XI ROBOTIC [place] (Right) as a surgical intervention.  The patient's history has been reviewed, patient examined, no change in status, stable for surgery.  I have reviewed the patient's chart and labs.  Questions were answered to the patient's satisfaction.     Jami Bogdanski Lysle Pearl

## 2019-08-08 NOTE — Anesthesia Postprocedure Evaluation (Signed)
Anesthesia Post Note  Patient: CAASI GIGLIA  Procedure(s) Performed: XI ROBOTIC ASSISTED INGUINAL HERNIA REPAIR WITH MESH (Right Inguinal)  Patient location during evaluation: PACU Anesthesia Type: General Level of consciousness: awake and alert Pain management: pain level controlled Vital Signs Assessment: post-procedure vital signs reviewed and stable Respiratory status: spontaneous breathing, nonlabored ventilation, respiratory function stable and patient connected to nasal cannula oxygen Cardiovascular status: blood pressure returned to baseline and stable Postop Assessment: no apparent nausea or vomiting Anesthetic complications: no     Last Vitals:  Vitals:   08/06/19 1530 08/06/19 1545  BP: (!) 104/59 104/71  Pulse: 76 83  Resp: 14 14  Temp: (!) 36.1 C   SpO2: 96% 100%    Last Pain:  Vitals:   08/06/19 1545  TempSrc:   PainSc: 4                  Joseph K Piscitello

## 2019-08-09 ENCOUNTER — Encounter: Payer: Self-pay | Admitting: Surgery

## 2020-11-14 ENCOUNTER — Telehealth: Payer: Self-pay | Admitting: Obstetrics and Gynecology

## 2020-11-14 NOTE — Telephone Encounter (Signed)
Pt will need Mirena Iud for 11/28/20 at 8:50 am

## 2020-11-23 NOTE — Telephone Encounter (Signed)
Noted. Will order to arrive by apt date/time. 

## 2020-11-28 ENCOUNTER — Ambulatory Visit: Payer: Self-pay | Admitting: Obstetrics and Gynecology

## 2020-11-28 NOTE — Telephone Encounter (Signed)
Patient is scheduled for 12/08/20 at 9:10 with KV

## 2020-12-08 ENCOUNTER — Other Ambulatory Visit (HOSPITAL_COMMUNITY)
Admission: RE | Admit: 2020-12-08 | Discharge: 2020-12-08 | Disposition: A | Discharge status: Home / Self Care | Payer: 59 | Source: Ambulatory Visit | Attending: Obstetrics and Gynecology | Admitting: Obstetrics and Gynecology

## 2020-12-08 ENCOUNTER — Ambulatory Visit (INDEPENDENT_AMBULATORY_CARE_PROVIDER_SITE_OTHER): Payer: Managed Care, Other (non HMO) | Admitting: Obstetrics and Gynecology

## 2020-12-08 ENCOUNTER — Other Ambulatory Visit: Payer: Self-pay

## 2020-12-08 ENCOUNTER — Encounter: Payer: Self-pay | Admitting: Obstetrics and Gynecology

## 2020-12-08 VITALS — BP 120/82 | Ht 62.0 in | Wt 169.0 lb

## 2020-12-08 DIAGNOSIS — Z30433 Encounter for removal and reinsertion of intrauterine contraceptive device: Secondary | ICD-10-CM | POA: Diagnosis not present

## 2020-12-08 DIAGNOSIS — Z124 Encounter for screening for malignant neoplasm of cervix: Secondary | ICD-10-CM

## 2020-12-08 DIAGNOSIS — Z30432 Encounter for removal of intrauterine contraceptive device: Secondary | ICD-10-CM

## 2020-12-08 DIAGNOSIS — Z30430 Encounter for insertion of intrauterine contraceptive device: Secondary | ICD-10-CM

## 2020-12-08 NOTE — Progress Notes (Signed)
  History of Present Illness:  Michaela Barajas is a 40 y.o. that had a Mirena IUD placed approximately 6 years ago. Since that time, she states that she has experienced amenorrhea, noting recent onset of vaginal spotting in the last few months. Patient does not recall when her last pap smear was.  The following portions of the patient's history were reviewed and updated as appropriate: allergies, current medications, past family history, past medical history, past social history, past surgical history and problem list.  There are no problems to display for this patient.  Medications:  Current Outpatient Medications on File Prior to Visit  Medication Sig Dispense Refill  . acetaminophen (TYLENOL) 500 MG tablet Take 1,000 mg by mouth every 6 (six) hours as needed for moderate pain.    . Biotin w/ Vitamins C & E (HAIR/SKIN/NAILS PO) Take 1 tablet by mouth daily.    . Prenatal Vit-Fe Fumarate-FA (PRENATAL MULTIVITAMIN) TABS tablet Take 1 tablet by mouth daily at 12 noon.    Marland Kitchen ibuprofen (ADVIL) 800 MG tablet Take 1 tablet (800 mg total) by mouth every 8 (eight) hours as needed for mild pain or moderate pain. (Patient not taking: Reported on 12/08/2020) 30 tablet 0   No current facility-administered medications on file prior to visit.   Allergies: has No Known Allergies.  Physical Exam:  BP 120/82   Ht 5\' 2"  (1.575 m)   Wt 169 lb (76.7 kg)   BMI 30.91 kg/m  Body mass index is 30.91 kg/m. Constitutional: Well nourished, well developed female in no acute distress.  Abdomen: diffusely non tender to palpation, non distended, and no masses, hernias Neuro: Grossly intact Psych:  Normal mood and affect.    Pelvic exam:  Two IUD strings present seen coming from the cervical os. EGBUS, vaginal vault and cervix: within normal limits  IUD Removal Strings of IUD identified and grasped.  IUD removed without problem.   Pt tolerated this well.  IUD noted to be intact.   IUD Insertion Procedure  Note Patient identified, informed consent performed, consent signed.   Discussed risks of irregular bleeding, cramping, infection, malpositioning or misplacement of the IUD outside the uterus which may require further procedure such as laparoscopy, risk of failure <1%. Time out was performed.  Urine pregnancy test negative.  A bimanual exam showed the uterus to be retroverted.  Speculum placed in the vagina.  Cervix visualized.  Cleaned with Betadine x 2. Uterus sounded to 8 cm.   Mirena IUD placed per manufacturer's recommendations.  Strings trimmed to 3 cm. Tenaculum was removed, good hemostasis noted.  Patient tolerated procedure well.   Patient was given post-procedure instructions.  Patient was also asked to check IUD strings periodically and follow up in 4 weeks for IUD check.   Assessment: IUD-LNG removal with reinsertion.  Plan: IUD removed and reinserted. She was amenable to this plan.  , CNM, MSN 12/08/2020 11:12 AM

## 2020-12-11 LAB — CYTOLOGY - PAP
Comment: NEGATIVE
Diagnosis: NEGATIVE
High risk HPV: NEGATIVE

## 2021-01-05 ENCOUNTER — Encounter: Payer: Self-pay | Admitting: Obstetrics and Gynecology

## 2021-01-05 ENCOUNTER — Ambulatory Visit (INDEPENDENT_AMBULATORY_CARE_PROVIDER_SITE_OTHER): Payer: Managed Care, Other (non HMO) | Admitting: Obstetrics and Gynecology

## 2021-01-05 ENCOUNTER — Other Ambulatory Visit: Payer: Self-pay

## 2021-01-05 VITALS — BP 100/66 | Wt 169.0 lb

## 2021-01-05 DIAGNOSIS — Z30431 Encounter for routine checking of intrauterine contraceptive device: Secondary | ICD-10-CM | POA: Diagnosis not present

## 2021-01-05 NOTE — Progress Notes (Signed)
  History of Present Illness:  Michaela Barajas is a 40 y.o. that had a Mirena IUD placed approximately 4 weeks ago. Since that time, she states that she has had some vaginal spotting since insertion. She states she had not previously experienced this with her previous two IUDs. She denies pain or cramping.  PMHx: She  has a past medical history of Family history of adverse reaction to anesthesia and History of methicillin resistant staphylococcus aureus (MRSA). Also,  has a past surgical history that includes Wisdom tooth extraction (07/2019) and XI Robotic assisted inguinal hernia repair with mesh (Right, 08/06/2019)., family history is not on file.,  reports that she has been smoking cigarettes. She has smoked for the past 23.00 years. She has never used smokeless tobacco. She reports current alcohol use. She reports that she does not use drugs. Current Meds  Medication Sig  . Biotin w/ Vitamins C & E (HAIR/SKIN/NAILS PO) Take 1 tablet by mouth daily.  . Prenatal Vit-Fe Fumarate-FA (PRENATAL MULTIVITAMIN) TABS tablet Take 1 tablet by mouth daily at 12 noon.  .  Also, has No Known Allergies..  Review of Systems  Constitutional: Negative.   HENT: Negative.   Eyes: Negative.   Respiratory: Negative.   Cardiovascular: Negative.   Gastrointestinal: Negative.   Genitourinary: Negative.   Musculoskeletal: Negative.   Skin: Negative.   Neurological: Negative.   Psychiatric/Behavioral: Negative.     Physical Exam:  BP 100/66   Wt 169 lb (76.7 kg)   BMI 30.91 kg/m  Body mass index is 30.91 kg/m. Constitutional: Well nourished, well developed female in no acute distress.  Abdomen: diffusely non tender to palpation, non distended, and no masses, hernias Neuro: Grossly intact Psych:  Normal mood and affect.    Pelvic exam:  Two IUD strings present seen coming from the cervical os. EGBUS, vaginal vault and cervix: within normal limits  Assessment: IUD strings present in proper location;  pt doing well  Plan: She was told to continue to use barrier contraception, in order to prevent any STIs, and to take a home pregnancy test or call us if she ever thinks she may be pregnant, and that her IUD expires in 6 years.  She was amenable to this plan and we will see her back in 1 year/PRN.  A total of 15 minutes were spent face-to-face with the patient as well as preparation, review, communication, and documentation during this encounter.   Michaela Barajas, CNM, MSN [facility], [facility] 01/05/2021  4:24 PM

## 2021-03-14 ENCOUNTER — Other Ambulatory Visit: Payer: Self-pay

## 2021-03-14 ENCOUNTER — Encounter: Payer: Self-pay | Admitting: Emergency Medicine

## 2021-03-14 ENCOUNTER — Emergency Department
Admission: EM | Admit: 2021-03-14 | Discharge: 2021-03-14 | Disposition: A | Discharge status: Home / Self Care | Payer: 59 | Attending: Emergency Medicine | Admitting: Emergency Medicine

## 2021-03-14 ENCOUNTER — Emergency Department: Payer: 59

## 2021-03-14 DIAGNOSIS — F17210 Nicotine dependence, cigarettes, uncomplicated: Secondary | ICD-10-CM | POA: Diagnosis not present

## 2021-03-14 DIAGNOSIS — K824 Cholesterolosis of gallbladder: Secondary | ICD-10-CM | POA: Insufficient documentation

## 2021-03-14 DIAGNOSIS — R1013 Epigastric pain: Secondary | ICD-10-CM

## 2021-03-14 DIAGNOSIS — R1011 Right upper quadrant pain: Secondary | ICD-10-CM

## 2021-03-14 LAB — COMPREHENSIVE METABOLIC PANEL
ALT: 21 U/L (ref 0–44)
AST: 13 U/L — ABNORMAL LOW (ref 15–41)
Albumin: 4.3 g/dL (ref 3.5–5.0)
Alkaline Phosphatase: 81 U/L (ref 38–126)
Anion gap: 8 (ref 5–15)
BUN: 10 mg/dL (ref 6–20)
CO2: 24 mmol/L (ref 22–32)
Calcium: 8.7 mg/dL — ABNORMAL LOW (ref 8.9–10.3)
Chloride: 104 mmol/L (ref 98–111)
Creatinine, Ser: 0.57 mg/dL (ref 0.44–1.00)
GFR, Estimated: >60 mL/min (ref >60)
Glucose, Bld: 97 mg/dL (ref 70–99)
Potassium: 3.6 mmol/L (ref 3.5–5.1)
Sodium: 136 mmol/L (ref 135–145)
Total Bilirubin: 0.6 mg/dL (ref 0.3–1.2)
Total Protein: 7.3 g/dL (ref 6.5–8.1)

## 2021-03-14 LAB — URINALYSIS, COMPLETE (UACMP) WITH MICROSCOPIC
Bilirubin Urine: NEGATIVE
Glucose, UA: NEGATIVE mg/dL
Ketones, ur: NEGATIVE mg/dL
Nitrite: NEGATIVE
Protein, ur: NEGATIVE mg/dL
Specific Gravity, Urine: 1.012 (ref 1.005–1.030)
pH: 5 (ref 5.0–8.0)

## 2021-03-14 LAB — CBC
HCT: 40.2 % (ref 36.0–46.0)
Hemoglobin: 13.5 g/dL (ref 12.0–15.0)
MCH: 31.2 pg (ref 26.0–34.0)
MCHC: 33.6 g/dL (ref 30.0–36.0)
MCV: 92.8 fL (ref 80.0–100.0)
Platelets: 310 10*3/uL (ref 150–400)
RBC: 4.33 MIL/uL (ref 3.87–5.11)
RDW: 12.8 % (ref 11.5–15.5)
WBC: 9.4 10*3/uL (ref 4.0–10.5)
nRBC: 0 % (ref 0.0–0.2)

## 2021-03-14 LAB — POC URINE PREG, ED: Preg Test, Ur: NEGATIVE

## 2021-03-14 LAB — LIPASE, BLOOD: Lipase: 28 U/L (ref 11–51)

## 2021-03-14 MED ORDER — ONDANSETRON HCL 4 MG/2ML IJ SOLN
4.0000 mg | Freq: Once | INTRAMUSCULAR | Status: AC
  Administered 2021-03-14: 4 mg via INTRAVENOUS
  Filled 2021-03-14: qty 2

## 2021-03-14 MED ORDER — LACTATED RINGERS IV BOLUS
1000.0000 mL | Freq: Once | INTRAVENOUS | Status: AC
  Administered 2021-03-14: 1000 mL via INTRAVENOUS

## 2021-03-14 MED ORDER — ALUM & MAG HYDROXIDE-SIMETH 200-200-20 MG/5ML PO SUSP
30.0000 mL | Freq: Once | ORAL | Status: AC
  Administered 2021-03-14: 30 mL via ORAL
  Filled 2021-03-14: qty 30

## 2021-03-14 MED ORDER — DICYCLOMINE HCL 10 MG PO CAPS: 10.0000 mg | ORAL_CAPSULE | Freq: Four times a day (QID) | ORAL | 0 refills | Status: DC

## 2021-03-14 MED ORDER — SUCRALFATE 1 G PO TABS: 1.0000 g | ORAL_TABLET | Freq: Four times a day (QID) | ORAL | 0 refills | Status: DC

## 2021-03-14 MED ORDER — PANTOPRAZOLE SODIUM 40 MG IV SOLR
40.0000 mg | Freq: Once | INTRAVENOUS | Status: AC
  Administered 2021-03-14: 40 mg via INTRAVENOUS
  Filled 2021-03-14: qty 40

## 2021-03-14 MED ORDER — LIDOCAINE VISCOUS HCL 2 % MT SOLN
15.0000 mL | Freq: Once | OROMUCOSAL | Status: AC
  Administered 2021-03-14: 15 mL via ORAL
  Filled 2021-03-14: qty 15

## 2021-03-14 MED ORDER — OMEPRAZOLE MAGNESIUM 20 MG PO TBEC: 20.0000 mg | DELAYED_RELEASE_TABLET | Freq: Every day | ORAL | 0 refills | Status: DC

## 2021-03-14 MED ORDER — DICYCLOMINE HCL 10 MG PO CAPS
20.0000 mg | ORAL_CAPSULE | Freq: Once | ORAL | Status: AC
  Administered 2021-03-14: 20 mg via ORAL
  Filled 2021-03-14: qty 2

## 2021-03-14 NOTE — ED Provider Notes (Signed)
[facility] Emergency Department Provider Note  ____________________________________________   Event Date/Time   First MD Initiated Contact with Patient 03/14/21 1215     (approximate)  I have reviewed the triage vital signs and the nursing notes.   HISTORY  Chief Complaint Abdominal Pain    HPI Michaela Barajas is a 40 y.o. female here with epigastric pain.  The patient states that for the last 2 weeks, she has had a mild cough, congestion, and has been taking NyQuil and DayQuil fairly regularly.  She states over the last 2 to 3 days, she has had intermittent, cramp-like, but severe at times, epigastric pain.  This pain seems significantly worsened with any attempted eating or drinking.  She has tried eating different foods and drinking, and experiences fairly significant cramping with any attempt.  It is primarily epigastric.  It does not radiate to the right upper quadrant.  No history of gallstones.  No fevers or chills.  She said some mild nausea but no vomiting.  She also notes she did have some alcohol the day prior to the onset of symptoms, though not a significant amount.  No history of pancreatitis.  No specific alleviating factors.        Past Medical History:  Diagnosis Date  . Family history of adverse reaction to anesthesia    maternal grandmother-  . History of methicillin resistant staphylococcus aureus (MRSA)     There are no problems to display for this patient.   Past Surgical History:  Procedure Laterality Date  . WISDOM TOOTH EXTRACTION  07/2019  . XI ROBOTIC ASSISTED INGUINAL HERNIA REPAIR WITH MESH Right 08/06/2019   Procedure: XI ROBOTIC ASSISTED INGUINAL HERNIA REPAIR WITH MESH;  Surgeon: Sung Amabile, DO;  Location: [facility] ORS;  Service: General;  Laterality: Right;    Prior to Admission medications   Medication Sig Start Date End Date Taking? Authorizing Provider  dicyclomine (BENTYL) 10 MG capsule Take 1 capsule (10 mg  total) by mouth 4 (four) times daily for 7 days. 03/14/21 03/21/21 Yes Shaune Pollack, MD  omeprazole (PRILOSEC OTC) 20 MG tablet Take 1 tablet (20 mg total) by mouth daily for 7 days. 03/14/21 03/21/21 Yes Shaune Pollack, MD  sucralfate (CARAFATE) 1 g tablet Take 1 tablet (1 g total) by mouth 4 (four) times daily for 7 days. 03/14/21 03/21/21 Yes Shaune Pollack, MD  acetaminophen (TYLENOL) 500 MG tablet Take 1,000 mg by mouth every 6 (six) hours as needed for moderate pain.    [provider]  Biotin w/ Vitamins C & E (HAIR/SKIN/NAILS PO) Take 1 tablet by mouth daily.    [provider]  ibuprofen (ADVIL) 800 MG tablet Take 1 tablet (800 mg total) by mouth every 8 (eight) hours as needed for mild pain or moderate pain. Patient not taking: Reported on 12/08/2020 08/06/19   Sung Amabile, DO  Prenatal Vit-Fe Fumarate-FA (PRENATAL MULTIVITAMIN) TABS tablet Take 1 tablet by mouth daily at 12 noon.    [provider]    Allergies Patient has no known allergies.  History reviewed. No pertinent family history.  Social History Social History   Tobacco Use  . Smoking status: Current Every Day Smoker    Years: 23.00    Types: Cigarettes  . Smokeless tobacco: Never Used  Vaping Use  . Vaping Use: Some days  . Substances: Flavoring  Substance Use Topics  . Alcohol use: Yes    Comment: occ  . Drug use: Never  Review of Systems  Review of Systems  Constitutional: Positive for fatigue. Negative for fever.  HENT: Negative for congestion and sore throat.   Eyes: Negative for visual disturbance.  Respiratory: Negative for cough and shortness of breath.   Cardiovascular: Negative for chest pain.  Gastrointestinal: Positive for abdominal pain and nausea. Negative for diarrhea and vomiting.  Genitourinary: Negative for flank pain.  Musculoskeletal: Negative for back pain and neck pain.  Skin: Negative for rash and wound.  Neurological: Negative for weakness.  All other  systems reviewed and are negative.    ____________________________________________  PHYSICAL EXAM:      VITAL SIGNS: ED Triage Vitals  Enc Vitals Group     BP 03/14/21 1220 (!) 139/104     Pulse Rate 03/14/21 1220 83     Resp 03/14/21 1220 18     Temp 03/14/21 1220 98.3 F (36.8 C)     Temp src --      SpO2 03/14/21 1220 96 %     Weight 03/14/21 1212 168 lb (76.2 kg)     Height 03/14/21 1212 5\' 2"  (1.575 m)     Head Circumference --      Peak Flow --      Pain Score 03/14/21 1212 8     Pain Loc --      Pain Edu? --      Excl. in GC? --      Physical Exam Vitals and nursing note reviewed.  Constitutional:      General: She is not in acute distress.    Appearance: She is well-developed.  HENT:     Head: Normocephalic and atraumatic.  Eyes:     Conjunctiva/sclera: Conjunctivae normal.  Cardiovascular:     Rate and Rhythm: Normal rate and regular rhythm.     Heart sounds: Normal heart sounds. No murmur heard. No friction rub.  Pulmonary:     Effort: Pulmonary effort is normal. No respiratory distress.     Breath sounds: Normal breath sounds. No wheezing or rales.  Abdominal:     General: There is no distension.     Palpations: Abdomen is soft.     Tenderness: There is abdominal tenderness in the epigastric area. There is no guarding or rebound.  Musculoskeletal:     Cervical back: Neck supple.  Skin:    General: Skin is warm.     Capillary Refill: Capillary refill takes less than 2 seconds.  Neurological:     Mental Status: She is alert and oriented to person, place, and time.     Motor: No abnormal muscle tone.       ____________________________________________   LABS (all labs ordered are listed, but only abnormal results are displayed)  Labs Reviewed  COMPREHENSIVE METABOLIC PANEL - Abnormal; Notable for the following components:      Result Value   Calcium 8.7 (*)    AST 13 (*)    All other components within normal limits  URINALYSIS, COMPLETE  (UACMP) WITH MICROSCOPIC - Abnormal; Notable for the following components:   Color, Urine YELLOW (*)    APPearance CLOUDY (*)    Hgb urine dipstick MODERATE (*)    Leukocytes,Ua MODERATE (*)    Bacteria, UA RARE (*)    All other components within normal limits  LIPASE, BLOOD  CBC  POC URINE PREG, ED    ____________________________________________  EKG: Normal sinus rhythm, ventricular rate 84.  PR 131, QRS 93, QTc 448.  No acute ST elevations or depressions.  No KG evidence of acute ischemia or infarct. ________________________________________  RADIOLOGY All imaging, including plain films, CT scans, and ultrasounds, independently reviewed by me, and interpretations confirmed via formal radiology reads.  ED MD interpretation:   Ultrasound: Gallbladder polyp, no gallstones or biliary distention, liver is unremarkable.  Official radiology report(s): US Abdomen Limited RUQ (LIVER/GB)  Result Date: 03/14/2021 CLINICAL DATA:  Right upper quadrant pain. EXAM: ULTRASOUND ABDOMEN LIMITED RIGHT UPPER QUADRANT COMPARISON:  Ultrasound report 09/29/2007. FINDINGS: Gallbladder: 3.4 mm polyp. No gallstones. Gallbladder wall thickness 1.4 mm. Negative Murphy sign. Common bile duct: Diameter: 1.4 mm Liver: Normal echogenicity. No focal hepatic abnormality identified. Portal vein is patent on color Doppler imaging with normal direction of blood flow towards the liver. Other: None. IMPRESSION: 1.  3.4 mm gallbladder polyp.  No gallstones or biliary distention. 2.  Liver appears unremarkable. Electronically Signed   By: Maisie Fus  Register   On: 03/14/2021 13:54    ____________________________________________  PROCEDURES   Procedure(s) performed (including Critical Care):  Procedures  ____________________________________________  INITIAL IMPRESSION / MDM / ASSESSMENT AND PLAN / ED COURSE  As part of my medical decision making, I reviewed the following data within the electronic MEDICAL RECORD NUMBER  Nursing notes reviewed and incorporated, Old chart reviewed, Notes from prior ED visits, and [place] Controlled Substance Database       *Michaela Barajas was evaluated in Emergency Department on 03/14/2021 for the symptoms described in the history of present illness. She was evaluated in the context of the global COVID-19 pandemic, which necessitated consideration that the patient might be at risk for infection with the SARS-CoV-2 virus that causes COVID-19. Institutional protocols and algorithms that pertain to the evaluation of patients at risk for COVID-19 are in a state of rapid change based on information released by regulatory bodies including the CDC and federal and state organizations. These policies and algorithms were followed during the patient's care in the ED.  Some ED evaluations and interventions may be delayed as a result of limited staffing during the pandemic.*     Medical Decision Making: 40 year old female here with mild epigastric abdominal pain.  No urinary symptoms.  Pain is worse with eating and drinking.  I suspect patient has likely mild gastritis, possibly secondary to taking over-the-counter cough and cold medication last week.  Differential also includes viral GI illness in the setting of her recent viral illness.  Ultrasound shows gallbladder polyp but no acute abnormality.  Her LFTs are normal.  Renal function normal.  CBC without leukocytosis.  Lipase negative.  UPT negative.  UA likely contaminated, has no urinary symptoms or signs of UTI.  She feels markedly improved with GI cocktail and Bentyl here.  Will discharge with antacids, trial of Bentyl, discharged with outpatient follow-up.  EKG nonischemic, no evidence of referred cardiac etiology.  ____________________________________________  FINAL CLINICAL IMPRESSION(S) / ED DIAGNOSES  Final diagnoses:  RUQ pain  Epigastric pain     MEDICATIONS GIVEN DURING THIS VISIT:  Medications  pantoprazole (PROTONIX)  injection 40 mg (40 mg Intravenous Given 03/14/21 1302)  lactated ringers bolus 1,000 mL (0 mLs Intravenous Stopped 03/14/21 1416)  alum & mag hydroxide-simeth (MAALOX/MYLANTA) 200-200-20 MG/5ML suspension 30 mL (30 mLs Oral Given 03/14/21 1302)    And  lidocaine (XYLOCAINE) 2 % viscous mouth solution 15 mL (15 mLs Oral Given 03/14/21 1302)  ondansetron (ZOFRAN) injection 4 mg (4 mg Intravenous Given 03/14/21 1302)  dicyclomine (BENTYL) capsule 20 mg (20 mg Oral Given 03/14/21 1401)  ED Discharge Orders         Ordered    omeprazole (PRILOSEC OTC) 20 MG tablet  Daily        03/14/21 1453    sucralfate (CARAFATE) 1 g tablet  4 times daily        03/14/21 1453    dicyclomine (BENTYL) 10 MG capsule  4 times daily        03/14/21 1453           Note:  This document was prepared using Dragon voice recognition software and may include unintentional dictation errors.   Shaune Pollack, MD 03/14/21 [phone]

## 2021-03-14 NOTE — ED Triage Notes (Signed)
Pt comes into the ED via POV c/o upper abdominal pain x3 days.  Pt denies any N/V or GERD symptoms.  Pt states she was sick last week but now she is having the diarrhea and stomach pains.  Pt is ambulatory to triage at this time with even and unlabored respirations.

## 2021-08-10 NOTE — Telephone Encounter (Signed)
Mirena rcvd/charged 12/08/20

## 2022-03-17 IMAGING — US US ABDOMEN LIMITED
1 series · 14 of 25 positions shown · non-contrast
Comparison: Ultrasound report 09/29/2007.

CLINICAL DATA: Right upper quadrant pain.

EXAM:
ULTRASOUND ABDOMEN LIMITED RIGHT UPPER QUADRANT

[Series 1: us abdomen limited ruq (liver/gb) · 14 of 63 slices shown]
[im 1/63]
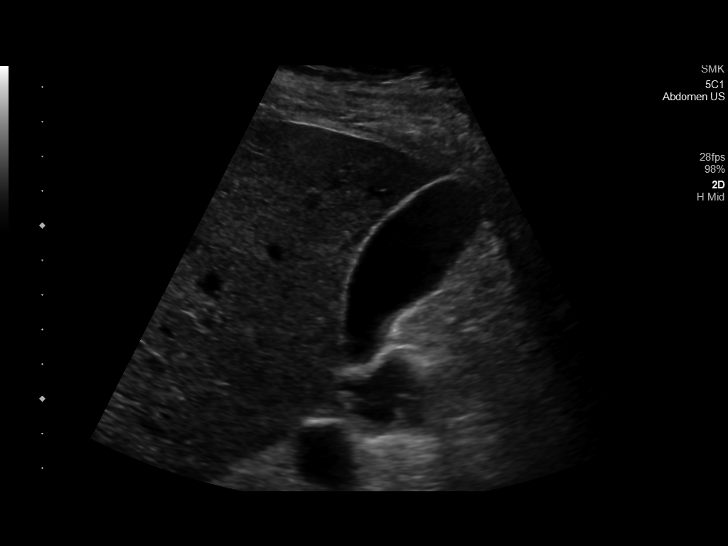
[im 6/63]
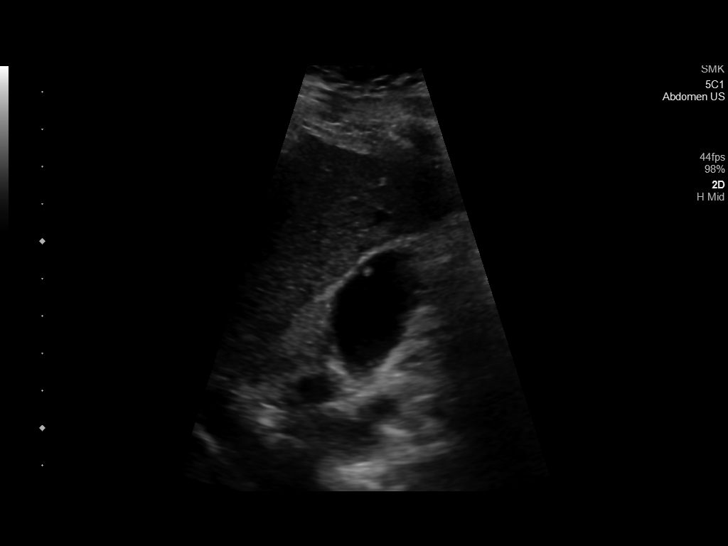
[im 11/63]
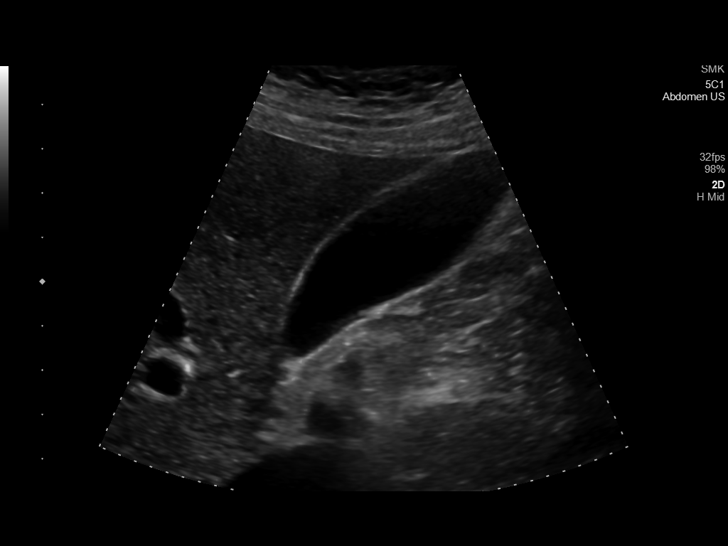
[im 16/63]
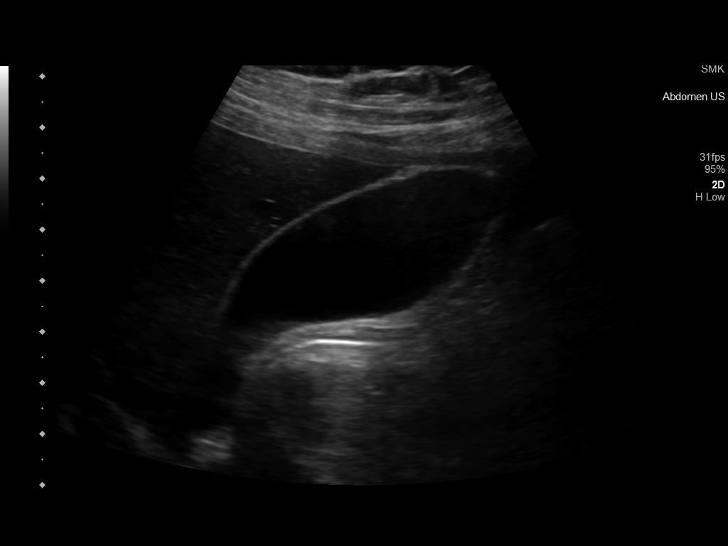
[im 21/63]
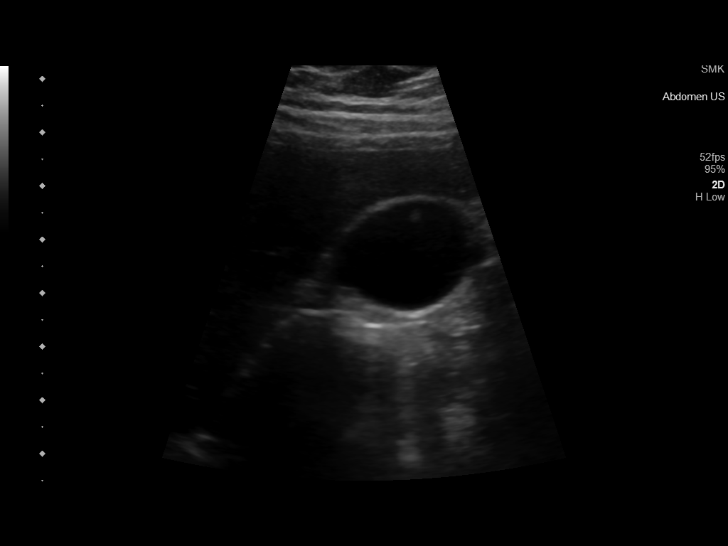
[im 24/63]
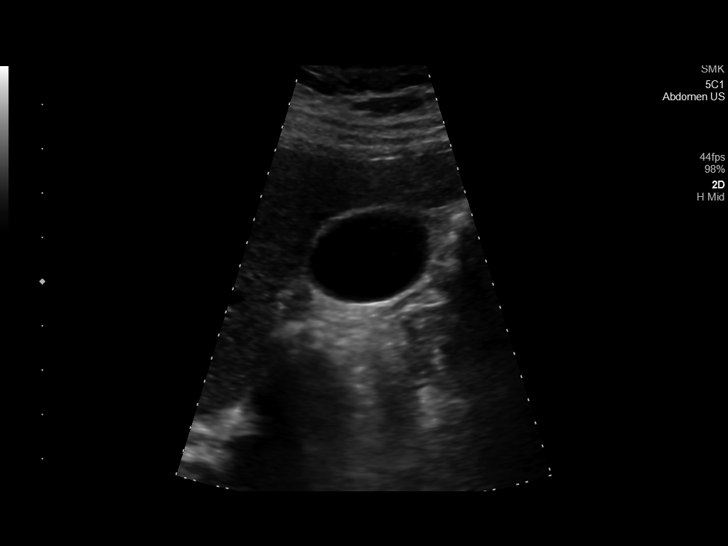
[im 29/63]
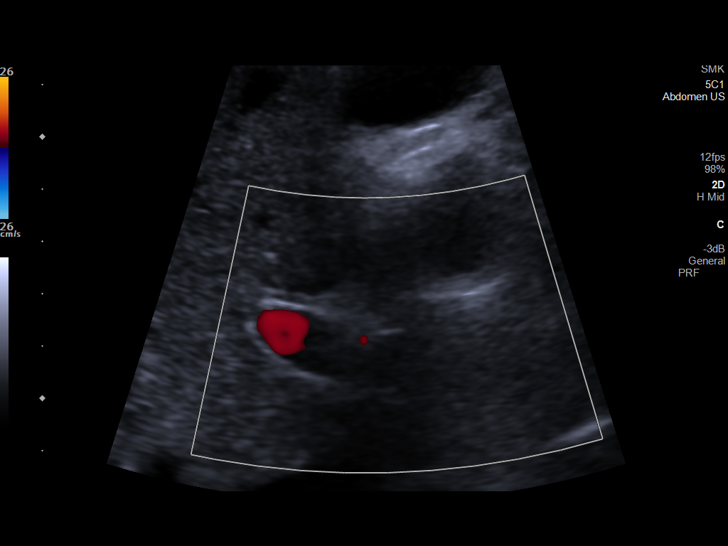
[im 34/63]
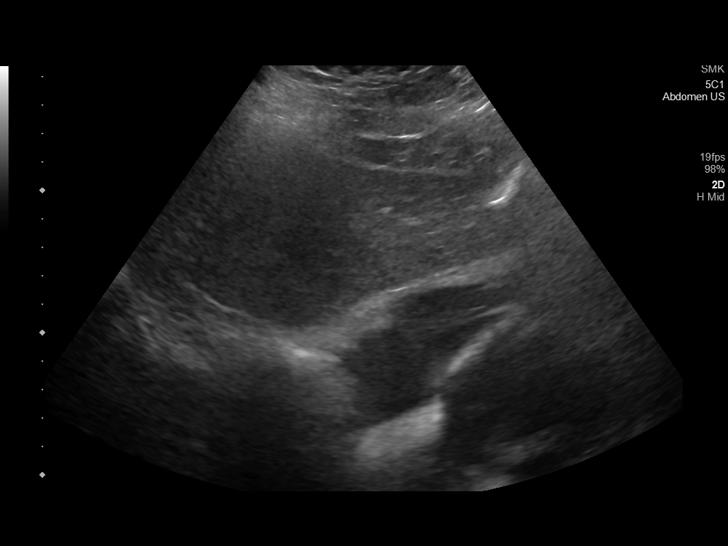
[im 39/63]
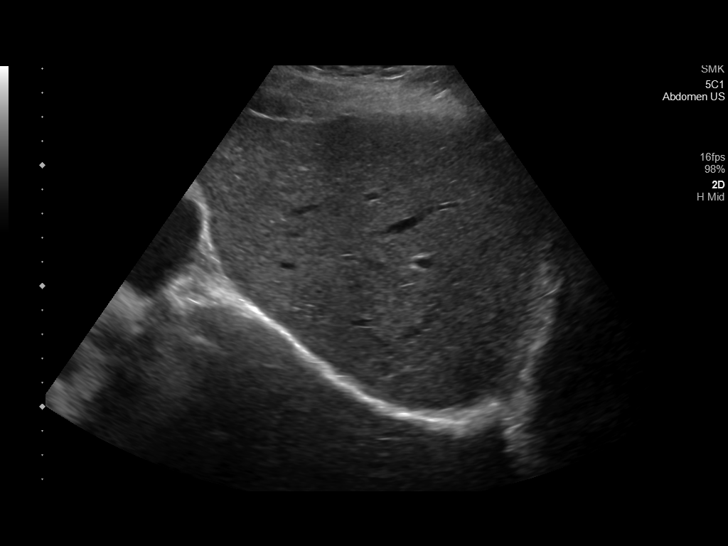
[im 42/63]
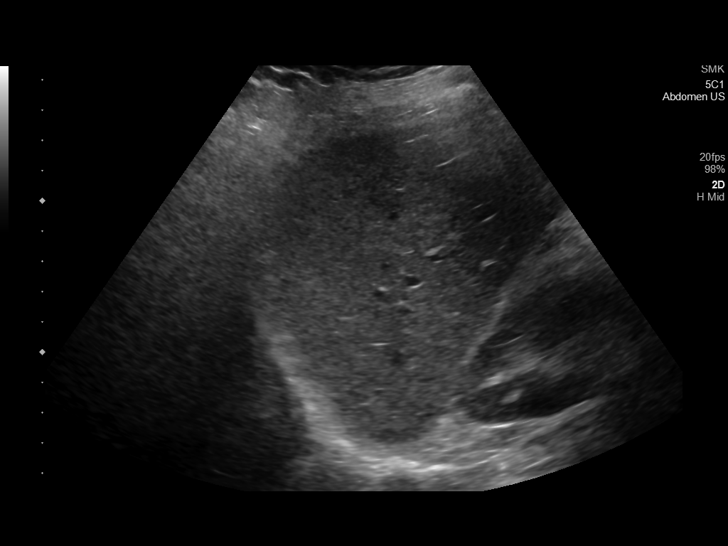
[im 47/63]
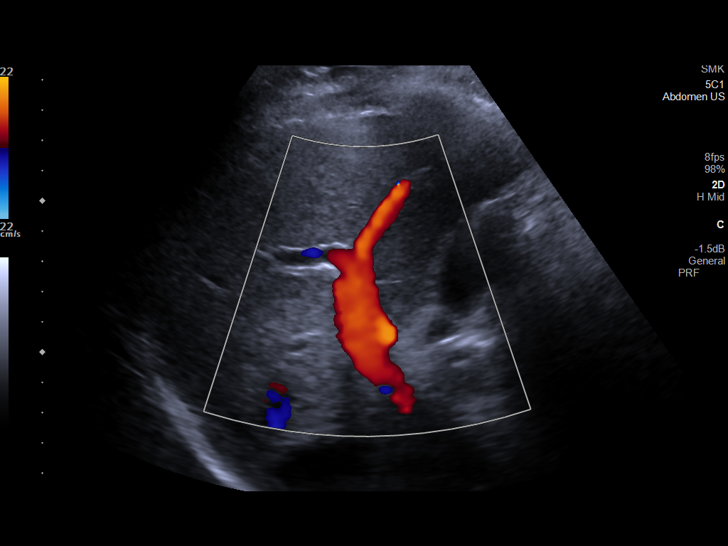
[im 52/63]
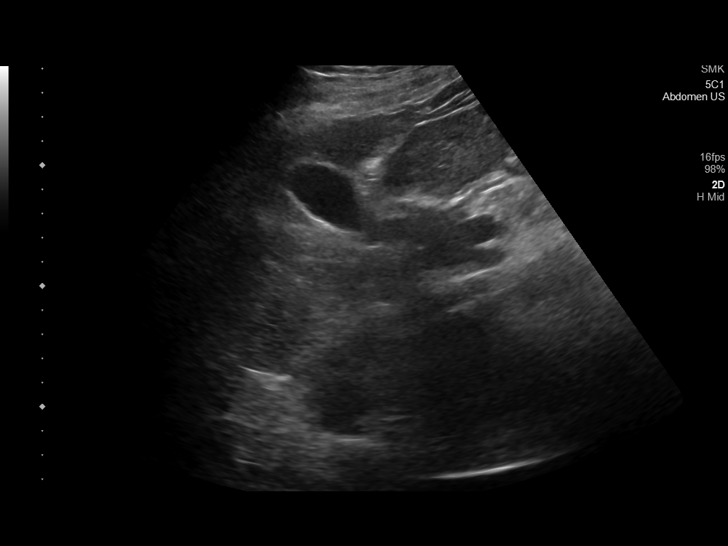
[im 57/63]
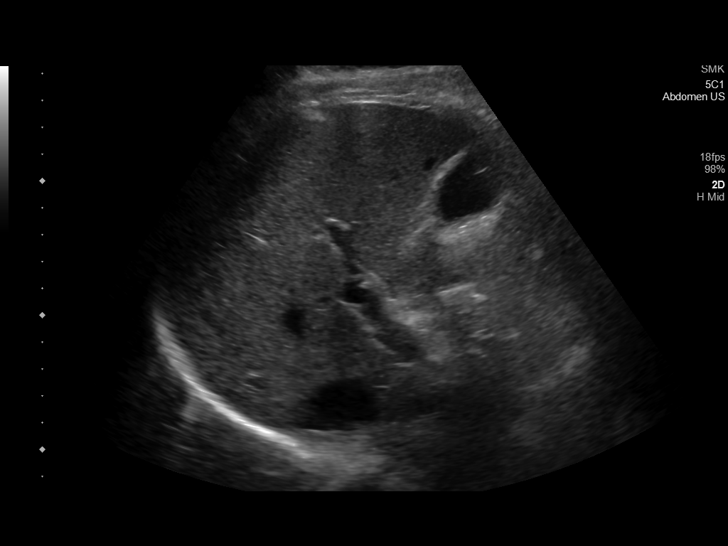
[im 63/63]
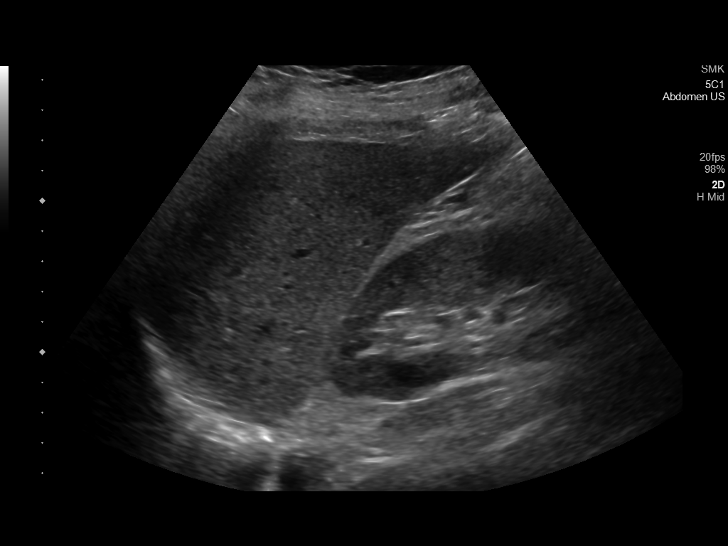

[14 of 25 positions shown; findings below may reference images not displayed]

FINDINGS: Gallbladder:

3.4 mm polyp. No gallstones. Gallbladder wall thickness 1.4 mm.
Negative Murphy sign.

Common bile duct:

Diameter: 1.4 mm

Liver:

Normal echogenicity. No focal hepatic abnormality identified. Portal
vein is patent on color Doppler imaging with normal direction of
blood flow towards the liver.

Other: None.
IMPRESSION: 1.  3.4 mm gallbladder polyp.  No gallstones or biliary distention.

2.  Liver appears unremarkable.

## 2023-04-27 ENCOUNTER — Emergency Department (HOSPITAL_COMMUNITY)
Admission: EM | Admit: 2023-04-27 | Discharge: 2023-04-27 | Disposition: A | Discharge status: Home / Self Care | Payer: 59 | Attending: Emergency Medicine | Admitting: Emergency Medicine

## 2023-04-27 ENCOUNTER — Emergency Department (HOSPITAL_COMMUNITY): Payer: 59

## 2023-04-27 DIAGNOSIS — Y92410 Unspecified street and highway as the place of occurrence of the external cause: Secondary | ICD-10-CM | POA: Insufficient documentation

## 2023-04-27 DIAGNOSIS — M542 Cervicalgia: Secondary | ICD-10-CM | POA: Diagnosis present

## 2023-04-27 DIAGNOSIS — M25551 Pain in right hip: Secondary | ICD-10-CM | POA: Insufficient documentation

## 2023-04-27 DIAGNOSIS — S161XXA Strain of muscle, fascia and tendon at neck level, initial encounter: Secondary | ICD-10-CM | POA: Insufficient documentation

## 2023-04-27 LAB — PREGNANCY, URINE: Preg Test, Ur: NEGATIVE

## 2023-04-27 MED ORDER — ACETAMINOPHEN 500 MG PO TABS
1000.0000 mg | ORAL_TABLET | Freq: Once | ORAL | Status: AC
  Administered 2023-04-27: 1000 mg via ORAL
  Filled 2023-04-27: qty 2

## 2023-04-27 MED ORDER — METHOCARBAMOL 500 MG PO TABS: 500.0000 mg | ORAL_TABLET | Freq: Three times a day (TID) | ORAL | 0 refills | Status: DC | PRN

## 2023-04-27 MED ORDER — METHOCARBAMOL 500 MG PO TABS
500.0000 mg | ORAL_TABLET | Freq: Once | ORAL | Status: AC
  Administered 2023-04-27: 500 mg via ORAL
  Filled 2023-04-27: qty 1

## 2023-04-27 NOTE — ED Notes (Signed)
EMS C-collar in place.  Pt complains of neck pain at this time.
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
## 2023-04-27 NOTE — Discharge Instructions (Addendum)
Take ibuprofen and/or Tylenol to help with your pain.  You are also being prescribed a muscle relaxer, which can help with muscular pain but can also cause potential side effects.  Thus do not take in combination with alcohol or other drugs and do not drive or operate heavy machinery while on this.  If you develop new or worsening pain, weakness or numbness, chest pain, trouble breathing, or any other new/concerning symptoms then return to the ER or call 911.
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
## 2024-05-27 ENCOUNTER — Other Ambulatory Visit: Payer: Self-pay | Admitting: Surgery

## 2024-06-04 ENCOUNTER — Encounter
Admission: RE | Admit: 2024-06-04 | Discharge: 2024-06-04 | Disposition: A | Discharge status: Home / Self Care | Source: Ambulatory Visit | Attending: Surgery | Admitting: Surgery

## 2024-06-04 ENCOUNTER — Other Ambulatory Visit: Payer: Self-pay

## 2024-06-04 VITALS — Ht 62.0 in | Wt 140.0 lb

## 2024-06-04 DIAGNOSIS — Z01812 Encounter for preprocedural laboratory examination: Secondary | ICD-10-CM

## 2024-06-04 HISTORY — DX: Carpal tunnel syndrome, bilateral upper limbs: G56.03

## 2024-06-04 HISTORY — DX: Unspecified mononeuropathy of bilateral upper limbs: G56.93

## 2024-06-04 HISTORY — DX: Cervicalgia: M54.2

## 2024-06-04 NOTE — Patient Instructions (Addendum)
 Your procedure is scheduled on: Wednesday 06/09/24  Report to the Registration Desk on the 1st floor of the [facility]. To find out your arrival time, please call [phone] between 1PM - 3PM on: Tuesday 06/08/24 If your arrival time is 6:00 am, do not arrive before that time as the [facility] entrance doors do not open until 6:00 am.  REMEMBER: Instructions that are not followed completely may result in serious medical risk, up to and including death; or upon the discretion of your surgeon and anesthesiologist your surgery may need to be rescheduled.  Do not eat food after midnight the night before surgery.  No gum chewing or hard candies.  You may however, drink CLEAR liquids up to 2 hours before you are scheduled to arrive for your surgery. Do not drink anything within 2 hours of your scheduled arrival time.  Clear liquids include: - water  - apple juice without pulp - gatorade (not RED colors) - black coffee or tea (Do NOT add milk or creamers to the coffee or tea) Do NOT drink anything that is not on this list.   In addition, your doctor has ordered for you to drink the provided:  Ensure Pre-Surgery Clear Carbohydrate Drink  Drinking this carbohydrate drink up to two hours before surgery helps to reduce insulin resistance and improve patient outcomes. Please complete drinking 2 hours before scheduled arrival time.  One week prior to surgery: Stop Anti-inflammatories (NSAIDS) such as Advil , Aleve, Ibuprofen , Motrin , Naproxen, Naprosyn and Aspirin based products such as Excedrin, Goody's Powder, BC Powder. Stop ANY OVER THE COUNTER supplements until after surgery. ibuprofen  (ADVIL ) 800 MG  Multiple Vitamin (MULTIVITAMIN PO)   You may however, continue to take Tylenol  if needed for pain up until the day of surgery.  Continue taking all of your other prescription medications up until the day of surgery.  ON THE DAY OF SURGERY ONLY TAKE THESE MEDICATIONS WITH SIPS OF  WATER:  None    No Alcohol for 24 hours before or after surgery.  No Smoking including e-cigarettes for 24 hours before surgery.  No chewable tobacco products for at least 6 hours before surgery.  No nicotine patches on the day of surgery.  Do not use any recreational drugs for at least a week (preferably 2 weeks) before your surgery.  Please be advised that the combination of cocaine and anesthesia may have negative outcomes, up to and including death. If you test positive for cocaine, your surgery will be cancelled.  On the morning of surgery brush your teeth with toothpaste and water, you may rinse your mouth with mouthwash if you wish. Do not swallow any toothpaste or mouthwash.  Use CHG Soap or wipes as directed on instruction sheet.  Do not wear jewelry, make-up, hairpins, clips or nail polish.  For welded (permanent) jewelry: bracelets, anklets, waist bands, etc.  Please have this removed prior to surgery.  If it is not removed, there is a chance that hospital personnel will need to cut it off on the day of surgery.  Do not wear lotions, powders, or perfumes.   Do not shave body hair from the neck down 48 hours before surgery.  Contact lenses, hearing aids and dentures may not be worn into surgery.  Do not bring valuables to the hospital. [facility] is not responsible for any missing/lost belongings or valuables.   Notify your doctor if there is any change in your medical condition (cold, fever, infection).  Wear comfortable clothing (specific  to your surgery type) to the hospital.  After surgery, you can help prevent lung complications by doing breathing exercises.  Take deep breaths and cough every 1-2 hours. Your doctor may order a device called an Incentive Spirometer to help you take deep breaths. When coughing or sneezing, hold a pillow firmly against your incision with both hands. This is called "splinting." Doing this helps protect your incision. It also  decreases belly discomfort.  If you are being admitted to the hospital overnight, leave your suitcase in the car. After surgery it may be brought to your room.  In case of increased patient census, it may be necessary for you, the patient, to continue your postoperative care in the Same Day Surgery department.  If you are being discharged the day of surgery, you will not be allowed to drive home. You will need a responsible individual to drive you home and stay with you for 24 hours after surgery.   If you are taking public transportation, you will need to have a responsible individual with you.  Please call the Pre-admissions Testing Dept. at [phone] if you have any questions about these instructions.  Surgery Visitation Policy:  Patients having surgery or a procedure may have two visitors.  Children under the age of 79 must have an adult with them who is not the patient.  Inpatient Visitation:    Visiting hours are 7 a.m. to 8 p.m. Up to four visitors are allowed at one time in a patient room. The visitors may rotate out with other people during the day.  One visitor age 59 or older may stay with the patient overnight and must be in the room by 8 p.m.   Merchandiser, retail to address health-related social needs:  [email or web address]    Preparing for Surgery with CHLORHEXIDINE  GLUCONATE (CHG) Soap  Chlorhexidine  Gluconate (CHG) Soap  o An antiseptic cleaner that kills germs and bonds with the skin to continue killing germs even after washing  o Used for showering the night before surgery and morning of surgery  Before surgery, you can play an important role by reducing the number of germs on your skin.  CHG (Chlorhexidine  gluconate) soap is an antiseptic cleanser which kills germs and bonds with the skin to continue killing germs even after washing.  Please do not use if you have an allergy to CHG or antibacterial soaps. If your skin becomes  reddened/irritated stop using the CHG.  1. Shower the NIGHT BEFORE SURGERY and the MORNING OF SURGERY with CHG soap.  2. If you choose to wash your hair, wash your hair first as usual with your normal shampoo.  3. After shampooing, rinse your hair and body thoroughly to remove the shampoo.  4. Use CHG as you would any other liquid soap. You can apply CHG directly to the skin and wash gently with a scrungie or a clean washcloth.  5. Apply the CHG soap to your body only from the neck down. Do not use on open wounds or open sores. Avoid contact with your eyes, ears, mouth, and genitals (private parts). Wash face and genitals (private parts) with your normal soap.  6. Wash thoroughly, paying special attention to the area where your surgery will be performed.  7. Thoroughly rinse your body with warm water.  8. Do not shower/wash with your normal soap after using and rinsing off the CHG soap.  9. Pat yourself dry with a clean towel.  10. Wear clean pajamas to bed the  night before surgery.  12. Place clean sheets on your bed the night of your first shower and do not sleep with pets.  13. Shower again with the CHG soap on the day of surgery prior to arriving at the hospital.  14. Do not apply any deodorants/lotions/powders.  15. Please wear clean clothes to the hospital.

## 2024-06-08 NOTE — H&P (Signed)
 History of Present Illness: Michaela Barajas is a 43 y.o. female who presents today for eval of ongoing bilateral carpal tunnel syndrome, right more severe than left. 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 The patient has undergone several injections all of which have provided relief however recently the injections have not been providing as much long standing relief as they did previously. The patient is right-hand dominant. She does work at [employer] and does do a lot of typing and a lot of repetitive motion. She most recently did undergo an injection in June of this year. The injections did provide relief, she reports 0-10 pain score. She does report a sharp pain at night that will wake her up. She does report burning and tingling into the palm of bilateral hands throughout the day as well at this time. She is quite frustrated by her continued ongoing bilateral hand and wrist pain and would like discuss more aggressive treatment options. She denies any personal history of heart attack, stroke, asthma or COPD. No personal history of blood clots. She is not diabetic.  Past Medical History: No past medical history on file.  Past Surgical History: HERNIA REPAIR Right 08/06/2019 (Dr. Tye)  HERNIA REPAIR  TONSILLECTOMY   Past Family History: History reviewed. No pertinent family history.  Medications: levonorgestreL  (MIRENA  52 MG) 20 mcg/24 hr (6 years) IUD Insert 1 each into the uterus once Follow package directions.  multivitamin/folic acid/biotin (HAIR-SKIN-NAILS, MV-FA-BIOTIN, ORAL) Take by mouth  prenatal vit-iron fum-folic ac (PRENAVITE) tablet Take 1 tablet by mouth once daily   Allergies: No Known Allergies   Review of Systems:  A comprehensive 14 point ROS was performed, reviewed by me today, and the pertinent orthopaedic findings are documented in the  HPI.  Physical Exam: BP 114/70  Ht 157.5 cm (5' 2)  Wt 75.5 kg (166 lb 6.4 oz)  BMI 30.43 kg/m  General/Constitutional: The patient appears to be well-nourished, well-developed, and in no acute distress. Neuro/Psych: Normal mood and affect, oriented to person, place and time. Eyes: Non-icteric. Pupils are equal, round, and reactive to light, and exhibit synchronous movement. ENT: Unremarkable. Lymphatic: No palpable adenopathy. Respiratory: Lungs clear to auscultation, Normal chest excursion, No wheezes, and Non-labored breathing Cardiovascular: Regular rate and rhythm. No murmurs. and No edema, swelling or tenderness, except as noted in detailed exam. Integumentary: No impressive skin lesions present, except as noted in detailed exam. Musculoskeletal: Unremarkable, except as noted in detailed exam.  Neck: Neck has full range of motion. There is no tenderness to palpation. Spurling's test is negative.   Upper Extremity Exam: Normal shoulder contour. Good active and passive range of motion and stability of the shoulder, elbow, and wrist. Normal motion of the hand and digits. No swelling, erythema, or ecchymosis is noted. There is no triggering or locking of the digits noted. Possible mild thenar atrophy is noted to the right hand. No intrinsic wasting. The patient has a positive Phalen's test. The patient has a positive Tinel's test. The patient has decreased pinprick and light touch sensation in the median nerve distribution. There is normal grip strength and pincer strength. The patient has less than 2 second capillary refill with good skin warmth. Normal radial and ulnar pulse is palpated.   Neurologic: Sensory function is intact, except as noted above. Motor strength is 5/5, except as noted above. No tremor or clonus is present. Good motor coordination  is noted.   Imaging: Nerve conduction study was performed in January of this year. The nerve conduction study did demonstrate evidence  of bilateral moderate carpal tunnel syndrome.  Impression: Carpal tunnel syndrome of right wrist  Plan:  1. Treatment options were discussed today with the patient. 2. The patient presents today with ongoing bilateral hand numbness and tingling, worse on the right hand. 3. The patient has undergone injections with moderate relief. The patient is quite frustrated by her continued bilateral hand numbness and tingling and would like to discuss more aggressive treatment options at this time. 4. The risk and benefits of the endoscopic carpal tunnel release procedure were discussed in detail at today's visit. After discussion of the risk and benefits the patient would like to proceed with a right endoscopic carpal tunnel release with Dr. Edie in the future. 5. This document will serve as a surgical history and physical for the patient. 6. The patient will follow-up per standard postop protocol. They can call the clinic they have any questions, new symptoms develop or symptoms worsen.  The procedure was discussed with the patient, as were the potential risks (including bleeding, infection, nerve and/or blood vessel injury, persistent or recurrent pain, failure of the release, progression of arthritis, need for further surgery, blood clots, strokes, heart attacks and/or arhythmias, pneumonia, etc.) and benefits. The patient states her understanding and wishes to proceed.    H&P reviewed and patient re-examined. No changes.

## 2024-06-09 ENCOUNTER — Ambulatory Visit: Payer: Self-pay | Admitting: Urgent Care

## 2024-06-09 ENCOUNTER — Other Ambulatory Visit: Payer: Self-pay

## 2024-06-09 ENCOUNTER — Ambulatory Visit
Admission: RE | Admit: 2024-06-09 | Discharge: 2024-06-09 | Disposition: A | Discharge status: Home / Self Care | Attending: Surgery | Admitting: Surgery

## 2024-06-09 ENCOUNTER — Ambulatory Visit

## 2024-06-09 ENCOUNTER — Encounter
Admission: RE | Disposition: A | Discharge status: Home / Self Care | Payer: Self-pay | Source: Home / Self Care | Attending: Surgery

## 2024-06-09 ENCOUNTER — Encounter: Payer: Self-pay | Admitting: Surgery

## 2024-06-09 DIAGNOSIS — Z79899 Other long term (current) drug therapy: Secondary | ICD-10-CM | POA: Insufficient documentation

## 2024-06-09 DIAGNOSIS — Z793 Long term (current) use of hormonal contraceptives: Secondary | ICD-10-CM | POA: Insufficient documentation

## 2024-06-09 DIAGNOSIS — G5601 Carpal tunnel syndrome, right upper limb: Secondary | ICD-10-CM | POA: Insufficient documentation

## 2024-06-09 DIAGNOSIS — Z01812 Encounter for preprocedural laboratory examination: Secondary | ICD-10-CM

## 2024-06-09 DIAGNOSIS — F17200 Nicotine dependence, unspecified, uncomplicated: Secondary | ICD-10-CM | POA: Insufficient documentation

## 2024-06-09 HISTORY — PX: CARPAL TUNNEL RELEASE: SHX101

## 2024-06-09 LAB — POCT PREGNANCY, URINE: Preg Test, Ur: NEGATIVE

## 2024-06-09 SURGERY — RELEASE, CARPAL TUNNEL, ENDOSCOPIC
Anesthesia: General | Site: Wrist | Laterality: Right

## 2024-06-09 MED ORDER — FENTANYL CITRATE (PF) 100 MCG/2ML IJ SOLN
INTRAMUSCULAR | Status: DC | PRN
  Administered 2024-06-09 (×2): 50 ug via INTRAVENOUS

## 2024-06-09 MED ORDER — ORAL CARE MOUTH RINSE: 15.0000 mL | Freq: Once | OROMUCOSAL | Status: AC

## 2024-06-09 MED ORDER — MIDAZOLAM HCL 2 MG/2ML IJ SOLN
INTRAMUSCULAR | Status: AC
  Filled 2024-06-09: qty 2

## 2024-06-09 MED ORDER — HYDROCODONE-ACETAMINOPHEN 5-325 MG PO TABS: 1.0000 | ORAL_TABLET | ORAL | Status: DC | PRN

## 2024-06-09 MED ORDER — OXYCODONE HCL 5 MG PO TABS: 5.0000 mg | ORAL_TABLET | Freq: Once | ORAL | Status: DC | PRN

## 2024-06-09 MED ORDER — ACETAMINOPHEN 325 MG PO TABS: 325.0000 mg | ORAL_TABLET | Freq: Four times a day (QID) | ORAL | Status: DC | PRN

## 2024-06-09 MED ORDER — DEXAMETHASONE SODIUM PHOSPHATE 10 MG/ML IJ SOLN
INTRAMUSCULAR | Status: AC
Start: 2024-06-09 — End: 2024-06-09
  Filled 2024-06-09: qty 1

## 2024-06-09 MED ORDER — DEXAMETHASONE SODIUM PHOSPHATE 10 MG/ML IJ SOLN
INTRAMUSCULAR | Status: DC | PRN
  Administered 2024-06-09: 4 mg via INTRAVENOUS

## 2024-06-09 MED ORDER — BUPIVACAINE HCL (PF) 0.5 % IJ SOLN
INTRAMUSCULAR | Status: DC | PRN
  Administered 2024-06-09: 10 mL

## 2024-06-09 MED ORDER — FENTANYL CITRATE (PF) 100 MCG/2ML IJ SOLN: 25.0000 ug | INTRAMUSCULAR | Status: DC | PRN

## 2024-06-09 MED ORDER — LIDOCAINE HCL (CARDIAC) PF 100 MG/5ML IV SOSY
PREFILLED_SYRINGE | INTRAVENOUS | Status: DC | PRN
Start: 2024-06-09 — End: 2024-06-09
  Administered 2024-06-09: 100 mg via INTRAVENOUS

## 2024-06-09 MED ORDER — ONDANSETRON HCL 4 MG/2ML IJ SOLN
INTRAMUSCULAR | Status: DC | PRN
  Administered 2024-06-09: 4 mg via INTRAVENOUS

## 2024-06-09 MED ORDER — PROPOFOL 10 MG/ML IV BOLUS
INTRAVENOUS | Status: AC
  Filled 2024-06-09: qty 20

## 2024-06-09 MED ORDER — KETOROLAC TROMETHAMINE 30 MG/ML IJ SOLN
30.0000 mg | Freq: Once | INTRAMUSCULAR | Status: AC
  Administered 2024-06-09: 30 mg via INTRAVENOUS

## 2024-06-09 MED ORDER — 0.9 % SODIUM CHLORIDE (POUR BTL) OPTIME
TOPICAL | Status: DC | PRN
  Administered 2024-06-09: 500 mL

## 2024-06-09 MED ORDER — LACTATED RINGERS IV SOLN: INTRAVENOUS | Status: DC

## 2024-06-09 MED ORDER — SODIUM CHLORIDE 0.9 % IV SOLN: INTRAVENOUS | Status: DC

## 2024-06-09 MED ORDER — KETOROLAC TROMETHAMINE 30 MG/ML IJ SOLN
INTRAMUSCULAR | Status: AC
  Filled 2024-06-09: qty 1

## 2024-06-09 MED ORDER — CEFAZOLIN SODIUM-DEXTROSE 2-4 GM/100ML-% IV SOLN
INTRAVENOUS | Status: AC
  Filled 2024-06-09: qty 100

## 2024-06-09 MED ORDER — ONDANSETRON HCL 4 MG/2ML IJ SOLN
INTRAMUSCULAR | Status: AC
  Filled 2024-06-09: qty 2

## 2024-06-09 MED ORDER — LIDOCAINE HCL (PF) 2 % IJ SOLN
INTRAMUSCULAR | Status: AC
  Filled 2024-06-09: qty 5

## 2024-06-09 MED ORDER — MIDAZOLAM HCL 2 MG/2ML IJ SOLN
INTRAMUSCULAR | Status: DC | PRN
  Administered 2024-06-09: 2 mg via INTRAVENOUS

## 2024-06-09 MED ORDER — OXYCODONE HCL 5 MG/5ML PO SOLN: 5.0000 mg | Freq: Once | ORAL | Status: DC | PRN

## 2024-06-09 MED ORDER — BUPIVACAINE HCL (PF) 0.5 % IJ SOLN
INTRAMUSCULAR | Status: AC
  Filled 2024-06-09: qty 30

## 2024-06-09 MED ORDER — METOCLOPRAMIDE HCL 5 MG/ML IJ SOLN: 5.0000 mg | Freq: Three times a day (TID) | INTRAMUSCULAR | Status: DC | PRN

## 2024-06-09 MED ORDER — CEFAZOLIN SODIUM-DEXTROSE 2-4 GM/100ML-% IV SOLN
2.0000 g | INTRAVENOUS | Status: AC
  Administered 2024-06-09: 2 g via INTRAVENOUS

## 2024-06-09 MED ORDER — PROPOFOL 10 MG/ML IV BOLUS
INTRAVENOUS | Status: DC | PRN
  Administered 2024-06-09: 200 mg via INTRAVENOUS

## 2024-06-09 MED ORDER — CHLORHEXIDINE GLUCONATE 0.12 % MT SOLN
15.0000 mL | Freq: Once | OROMUCOSAL | Status: AC
  Administered 2024-06-09: 15 mL via OROMUCOSAL

## 2024-06-09 MED ORDER — ONDANSETRON HCL 4 MG/2ML IJ SOLN: 4.0000 mg | Freq: Four times a day (QID) | INTRAMUSCULAR | Status: DC | PRN

## 2024-06-09 MED ORDER — METOCLOPRAMIDE HCL 10 MG PO TABS: 5.0000 mg | ORAL_TABLET | Freq: Three times a day (TID) | ORAL | Status: DC | PRN

## 2024-06-09 MED ORDER — ONDANSETRON HCL 4 MG PO TABS: 4.0000 mg | ORAL_TABLET | Freq: Four times a day (QID) | ORAL | Status: DC | PRN

## 2024-06-09 MED ORDER — CHLORHEXIDINE GLUCONATE 0.12 % MT SOLN
OROMUCOSAL | Status: AC
  Filled 2024-06-09: qty 15

## 2024-06-09 MED ORDER — FENTANYL CITRATE (PF) 100 MCG/2ML IJ SOLN
INTRAMUSCULAR | Status: AC
  Filled 2024-06-09: qty 2

## 2024-06-09 SURGICAL SUPPLY — 30 items
BNDG COHESIVE 4X5 TAN STRL LF (GAUZE/BANDAGES/DRESSINGS) ×1 IMPLANT
BNDG ELASTIC 2INX 5YD STR LF (GAUZE/BANDAGES/DRESSINGS) ×1 IMPLANT
BNDG ESMARCH 4X12 STRL LF (GAUZE/BANDAGES/DRESSINGS) ×1 IMPLANT
CHLORAPREP W/TINT 26 (MISCELLANEOUS) ×1 IMPLANT
CORD BIP STRL DISP 12FT (MISCELLANEOUS) ×1 IMPLANT
CUFF TOURN SGL QUICK 18X4 (TOURNIQUET CUFF) ×1 IMPLANT
DRAPE SURG 17X11 SM STRL (DRAPES) ×1 IMPLANT
FORCEPS JEWEL BIP 4-3/4 STR (INSTRUMENTS) ×1 IMPLANT
GAUZE SPONGE 4X4 12PLY STRL (GAUZE/BANDAGES/DRESSINGS) ×1 IMPLANT
GAUZE XEROFORM 1X8 LF (GAUZE/BANDAGES/DRESSINGS) ×1 IMPLANT
GLOVE BIO SURGEON STRL SZ8 (GLOVE) ×1 IMPLANT
GLOVE INDICATOR 8.0 STRL GRN (GLOVE) ×1 IMPLANT
GOWN STRL REUS W/ TWL LRG LVL3 (GOWN DISPOSABLE) ×1 IMPLANT
GOWN STRL REUS W/ TWL XL LVL3 (GOWN DISPOSABLE) ×1 IMPLANT
KIT ESCP INSRT D SLOT CANN KN (MISCELLANEOUS) ×1 IMPLANT
KIT TURNOVER KIT A (KITS) ×1 IMPLANT
MANIFOLD NEPTUNE II (INSTRUMENTS) ×1 IMPLANT
NS IRRIG 500ML POUR BTL (IV SOLUTION) ×1 IMPLANT
PACK EXTREMITY ARMC (MISCELLANEOUS) ×1 IMPLANT
PENCIL SMOKE EVACUATOR (MISCELLANEOUS) ×1 IMPLANT
SPLINT WRIST LG LT TX990309 (SOFTGOODS) IMPLANT
SPLINT WRIST LG RT TX900304 (SOFTGOODS) IMPLANT
SPLINT WRIST M LT TX990308 (SOFTGOODS) IMPLANT
SPLINT WRIST M RT TX990303 (SOFTGOODS) IMPLANT
SPLINT WRIST XL LT TX990310 (SOFTGOODS) IMPLANT
SPLINT WRIST XL RT TX990305 (SOFTGOODS) IMPLANT
STOCKINETTE IMPERVIOUS 9X36 MD (GAUZE/BANDAGES/DRESSINGS) ×1 IMPLANT
SUT PROLENE 4 0 PS 2 18 (SUTURE) ×1 IMPLANT
TRAP FLUID SMOKE EVACUATOR (MISCELLANEOUS) IMPLANT
WATER STERILE IRR 500ML POUR (IV SOLUTION) IMPLANT

## 2024-06-09 NOTE — Anesthesia Preprocedure Evaluation (Signed)
 Anesthesia Evaluation  Patient identified by MRN, date of birth, ID band Patient awake    Reviewed: Allergy & Precautions, NPO status , Patient's Chart, lab work & pertinent test results  History of Anesthesia Complications Negative for: history of anesthetic complications  Airway Mallampati: II  TM Distance: >3 FB Neck ROM: full    Dental no notable dental hx.    Pulmonary neg pulmonary ROS, Current Smoker and Patient abstained from smoking.   Pulmonary exam normal        Cardiovascular negative cardio ROS Normal cardiovascular exam     Neuro/Psych  Neuromuscular disease  negative psych ROS   GI/Hepatic negative GI ROS, Neg liver ROS,,,  Endo/Other  negative endocrine ROS    Renal/GU negative Renal ROS  negative genitourinary   Musculoskeletal   Abdominal   Peds  Hematology negative hematology ROS (+)   Anesthesia Other Findings Past Medical History: No date: Bilateral carpal tunnel syndrome No date: Bilateral neuropathy of upper extremities No date: Cervicalgia No date: Family history of adverse reaction to anesthesia     Comment:  maternal grandmother- No date: History of methicillin resistant staphylococcus aureus (MRSA)  Past Surgical History: 07/2019: WISDOM TOOTH EXTRACTION 08/06/2019: XI ROBOTIC ASSISTED INGUINAL HERNIA REPAIR WITH MESH;  Right     Comment:  Procedure: XI ROBOTIC ASSISTED INGUINAL HERNIA REPAIR               WITH MESH;  Surgeon: Tye Millet, DO;  Location: [facility]               ORS;  Service: General;  Laterality: Right;  BMI    Body Mass Index: 28.35 kg/m      Reproductive/Obstetrics negative OB ROS                              Anesthesia Physical Anesthesia Plan  ASA: 2  Anesthesia Plan: General LMA   Post-op Pain Management: Toradol  IV (intra-op)* and Ofirmev  IV (intra-op)*   Induction: Intravenous  PONV Risk Score and Plan: 3 and  Dexamethasone , Ondansetron , Midazolam  and Treatment may vary due to age or medical condition  Airway Management Planned: Natural Airway and Nasal Cannula  Additional Equipment:   Intra-op Plan:   Post-operative Plan:   Informed Consent: I have reviewed the patients History and Physical, chart, labs and discussed the procedure including the risks, benefits and alternatives for the proposed anesthesia with the patient or authorized representative who has indicated his/her understanding and acceptance.     Dental Advisory Given  Plan Discussed with: Anesthesiologist, CRNA and Surgeon  Anesthesia Plan Comments: (Patient consented for risks of anesthesia including but not limited to:  - adverse reactions to medications - risk of airway placement if required - damage to eyes, teeth, lips or other oral mucosa - nerve damage due to positioning  - sore throat or hoarseness - Damage to heart, brain, nerves, lungs, other parts of body or loss of life  Patient voiced understanding and assent.)        Anesthesia Quick Evaluation

## 2024-06-09 NOTE — Transfer of Care (Signed)
 Immediate Anesthesia Transfer of Care Note  Patient: Michaela Barajas  Procedure(s) Performed: RELEASE, CARPAL TUNNEL, ENDOSCOPIC (Right: Wrist)  Patient Location: PACU  Anesthesia Type:General  Level of Consciousness: awake and alert   Airway & Oxygen Therapy: Patient Spontanous Breathing and Patient connected to nasal cannula oxygen  Post-op Assessment: Report given to RN and Post -op Vital signs reviewed and stable  Post vital signs: Reviewed and stable  Last Vitals:  Vitals Value Taken Time  BP 109/77 0812  Temp 97.1 0812  Pulse 78 0812  Resp 16 0812  SpO2 99% 0812    Last Pain:  Vitals:   06/09/24 0618  TempSrc: Temporal  PainSc: 0-No pain         Complications: No notable events documented.

## 2024-06-09 NOTE — Discharge Instructions (Addendum)
 Orthopedic discharge instructions: Keep dressing dry and intact. Keep hand elevated above heart level. May shower after dressing removed on postop day 4 (Sunday). Cover sutures with Band-Aids after drying off. Apply ice to affected area frequently. Take ibuprofen  600-800 mg TID with meals for 3-5 days, then as necessary. Take ES Tylenol  for breakthrough pain if needed.  Return for follow-up in 10-14 days or as scheduled.

## 2024-06-09 NOTE — Op Note (Signed)
 06/09/2024  8:10 AM  Patient:   Michaela Barajas  Pre-Op Diagnosis:   Right carpal tunnel syndrome.  Post-Op Diagnosis:   Same.  Procedure:   Endoscopic right carpal tunnel release.  Surgeon:   DOROTHA Reyes Maltos, MD  Anesthesia:   General LMA  Findings:   As above.  Complications:   None  EBL:   0 cc  Fluids:   600 cc crystalloid  TT:   11 minutes at 250 mmHg  Drains:   None  Closure:   4-0 Prolene interrupted sutures  Brief Clinical Note:   The patient is a 43 year old female with a history of progressively worsening pain and paresthesias to her right hand. Her symptoms have progressed despite medications, activity modification, etc. Her history and examination consistent with carpal tunnel syndrome, confirmed by EMG. The patient presents at this time for an endoscopic right carpal tunnel release.   Procedure:   The patient was brought into the operating room and lain in the supine position. After adequate general laryngeal mask anesthesia was obtained, the right hand and upper extremity were prepped with ChloraPrep solution before being draped sterilely. Preoperative antibiotics were administered. A timeout was performed to verify the appropriate surgical site before the limb was exsanguinated with an Esmarch and the tourniquet inflated to 250 mmHg.   An approximately 1.5-2 cm incision was made over the volar wrist flexion crease, centered over the palmaris longus tendon. The incision was carried down through the subcutaneous tissues with care taken to identify and protect any neurovascular structures. The distal forearm fascia was penetrated just proximal to the transverse carpal ligament. The soft tissues were released off the superficial and deep surfaces of the distal forearm fascia and this was released proximally for 3-4 cm under direct visualization.  Attention was directed distally. The Therapist, nutritional was passed beneath the transverse carpal ligament along the ulnar  aspect of the carpal tunnel and used to release any adhesions as well as to remove any adherent synovial tissue before first the smaller then the larger of the two dilators were passed beneath the transverse carpal ligament along the ulnar margin of the carpal tunnel. The slotted cannula was introduced and the endoscope was placed into the slotted cannula and the undersurface of the transverse carpal ligament visualized. The distal margin of the transverse carpal ligament was marked by placing a 25-gauge needle percutaneously at Kaplan's cardinal point so that it entered the distal portion of the slotted cannula. Under endoscopic visualization, the transverse carpal ligament was released from proximal to distal using the end-cutting blade. A second pass was performed to ensure complete release of the ligament. The adequacy of release was verified both endoscopically and by palpation using the freer elevator.  The wound was irrigated thoroughly with sterile saline solution before being closed using 4-0 Prolene interrupted sutures. A total of 10 cc of 0.5% plain Sensorcaine  was injected in and around the incision before a sterile bulky dressing was applied to the wound. The patient was placed into a volar wrist splint before being awakened, extubated, and returned to the recovery room in satisfactory condition after tolerating the procedure well.

## 2024-06-09 NOTE — Anesthesia Procedure Notes (Signed)
 Procedure Name: LMA Insertion Date/Time: 06/09/2024 7:45 AM  Performed by: Trudy Rankin LABOR, CRNAPre-anesthesia Checklist: Patient identified, Emergency Drugs available, Suction available, Patient being monitored and Timeout performed Patient Re-evaluated:Patient Re-evaluated prior to induction Oxygen Delivery Method: Circle system utilized Preoxygenation: Pre-oxygenation with 100% oxygen Induction Type: IV induction LMA: LMA with gastric port inserted LMA Size: 3.0 Number of attempts: 1 Tube secured with: Tape Dental Injury: Teeth and Oropharynx as per pre-operative assessment

## 2024-06-09 NOTE — Anesthesia Postprocedure Evaluation (Signed)
 Anesthesia Post Note  Patient: Michaela Barajas  Procedure(s) Performed: RELEASE, CARPAL TUNNEL, ENDOSCOPIC (Right: Wrist)  Patient location during evaluation: PACU Anesthesia Type: General Level of consciousness: awake and alert Pain management: pain level controlled Vital Signs Assessment: post-procedure vital signs reviewed and stable Respiratory status: spontaneous breathing, nonlabored ventilation, respiratory function stable and patient connected to nasal cannula oxygen Cardiovascular status: blood pressure returned to baseline and stable Postop Assessment: no apparent nausea or vomiting Anesthetic complications: no   No notable events documented.   Last Vitals:  Vitals:   06/09/24 0830 06/09/24 0845  BP: 113/75 118/72  Pulse: 73 66  Resp: 15 20  Temp:  37.1 C  SpO2: 100% 100%    Last Pain:  Vitals:   06/09/24 0845  TempSrc:   PainSc: 0-No pain                 Lendia LITTIE Mae

## 2024-06-10 ENCOUNTER — Encounter: Payer: Self-pay | Admitting: Surgery
